# Patient Record
Sex: Female | Born: 1980
Health system: Southern US, Community
[De-identification: ages and names within clinical notes are randomized; demographics above are authoritative.]

## PROBLEM LIST (undated history)

## (undated) DIAGNOSIS — E611 Iron deficiency: Secondary | ICD-10-CM

## (undated) DIAGNOSIS — E785 Hyperlipidemia, unspecified: Secondary | ICD-10-CM

## (undated) DIAGNOSIS — G4730 Sleep apnea, unspecified: Secondary | ICD-10-CM

## (undated) DIAGNOSIS — F419 Anxiety disorder, unspecified: Secondary | ICD-10-CM

## (undated) DIAGNOSIS — K8020 Calculus of gallbladder without cholecystitis without obstruction: Secondary | ICD-10-CM

## (undated) DIAGNOSIS — Z9889 Other specified postprocedural states: Secondary | ICD-10-CM

## (undated) DIAGNOSIS — R112 Nausea with vomiting, unspecified: Secondary | ICD-10-CM

## (undated) DIAGNOSIS — E282 Polycystic ovarian syndrome: Secondary | ICD-10-CM

## (undated) DIAGNOSIS — K219 Gastro-esophageal reflux disease without esophagitis: Secondary | ICD-10-CM

## (undated) DIAGNOSIS — J45909 Unspecified asthma, uncomplicated: Secondary | ICD-10-CM

## (undated) DIAGNOSIS — F909 Attention-deficit hyperactivity disorder, unspecified type: Secondary | ICD-10-CM

## (undated) HISTORY — DX: Polycystic ovarian syndrome: E28.2

## (undated) HISTORY — PX: NASAL FRACTURE SURGERY: SHX718

## (undated) HISTORY — PX: FRACTURE SURGERY: SHX138

## (undated) HISTORY — DX: Attention-deficit hyperactivity disorder, unspecified type: F90.9

---

## 2008-03-01 ENCOUNTER — Emergency Department: Payer: Self-pay | Admitting: Emergency Medicine

## 2008-06-10 ENCOUNTER — Emergency Department: Payer: Self-pay | Admitting: Emergency Medicine

## 2008-07-19 ENCOUNTER — Emergency Department (HOSPITAL_COMMUNITY): Admission: EM | Admit: 2008-07-19 | Discharge: 2008-07-19 | Payer: Self-pay | Admitting: Emergency Medicine

## 2008-07-21 ENCOUNTER — Ambulatory Visit (HOSPITAL_BASED_OUTPATIENT_CLINIC_OR_DEPARTMENT_OTHER): Admission: RE | Admit: 2008-07-21 | Discharge: 2008-07-21 | Payer: Self-pay | Admitting: Otolaryngology
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
## 2009-10-14 ENCOUNTER — Emergency Department: Payer: Self-pay | Admitting: Emergency Medicine

## 2010-07-30 ENCOUNTER — Emergency Department: Payer: Self-pay | Admitting: Emergency Medicine

## 2010-12-08 ENCOUNTER — Ambulatory Visit: Payer: Self-pay | Admitting: Orthopedic Surgery

## 2010-12-08 HISTORY — PX: GANGLION CYST EXCISION: SHX1691

## 2010-12-14 LAB — PATHOLOGY REPORT
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
## 2011-09-15 LAB — POCT HEMOGLOBIN-HEMACUE: Hemoglobin: 13.8

## 2011-11-16 ENCOUNTER — Emergency Department: Payer: Self-pay | Admitting: Emergency Medicine

## 2014-12-04 DIAGNOSIS — R413 Other amnesia: Secondary | ICD-10-CM

## 2014-12-04 DIAGNOSIS — R41840 Attention and concentration deficit: Secondary | ICD-10-CM

## 2015-03-26 ENCOUNTER — Ambulatory Visit
Admit: 2015-03-26 | Disposition: A | Discharge status: Home / Self Care | Payer: Self-pay | Attending: Unknown Physician Specialty | Admitting: Unknown Physician Specialty

## 2015-04-30 ENCOUNTER — Encounter: Payer: Self-pay | Admitting: *Deleted

## 2015-04-30 ENCOUNTER — Ambulatory Visit: Payer: 59 | Admitting: Anesthesiology

## 2015-04-30 ENCOUNTER — Ambulatory Visit
Admission: RE | Admit: 2015-04-30 | Discharge: 2015-04-30 | Disposition: A | Discharge status: Home / Self Care | Payer: 59 | Source: Ambulatory Visit | Attending: Unknown Physician Specialty | Admitting: Unknown Physician Specialty

## 2015-04-30 ENCOUNTER — Encounter
Admission: RE | Disposition: A | Discharge status: Home / Self Care | Payer: Self-pay | Source: Ambulatory Visit | Attending: Unknown Physician Specialty

## 2015-04-30 DIAGNOSIS — K210 Gastro-esophageal reflux disease with esophagitis: Secondary | ICD-10-CM | POA: Diagnosis not present

## 2015-04-30 DIAGNOSIS — K633 Ulcer of intestine: Secondary | ICD-10-CM | POA: Diagnosis not present

## 2015-04-30 DIAGNOSIS — R1012 Left upper quadrant pain: Secondary | ICD-10-CM | POA: Insufficient documentation

## 2015-04-30 DIAGNOSIS — Z6830 Body mass index (BMI) 30.0-30.9, adult: Secondary | ICD-10-CM | POA: Insufficient documentation

## 2015-04-30 DIAGNOSIS — K640 First degree hemorrhoids: Secondary | ICD-10-CM | POA: Insufficient documentation

## 2015-04-30 DIAGNOSIS — J45990 Exercise induced bronchospasm: Secondary | ICD-10-CM | POA: Insufficient documentation

## 2015-04-30 DIAGNOSIS — J342 Deviated nasal septum: Secondary | ICD-10-CM | POA: Diagnosis not present

## 2015-04-30 DIAGNOSIS — K529 Noninfective gastroenteritis and colitis, unspecified: Secondary | ICD-10-CM | POA: Diagnosis not present

## 2015-04-30 DIAGNOSIS — K2950 Unspecified chronic gastritis without bleeding: Secondary | ICD-10-CM | POA: Diagnosis not present

## 2015-04-30 DIAGNOSIS — Z881 Allergy status to other antibiotic agents status: Secondary | ICD-10-CM | POA: Diagnosis not present

## 2015-04-30 DIAGNOSIS — Z79899 Other long term (current) drug therapy: Secondary | ICD-10-CM | POA: Diagnosis not present

## 2015-04-30 DIAGNOSIS — R634 Abnormal weight loss: Secondary | ICD-10-CM | POA: Diagnosis not present

## 2015-04-30 DIAGNOSIS — R1032 Left lower quadrant pain: Secondary | ICD-10-CM | POA: Insufficient documentation

## 2015-04-30 DIAGNOSIS — E669 Obesity, unspecified: Secondary | ICD-10-CM | POA: Diagnosis not present

## 2015-04-30 HISTORY — PX: ESOPHAGOGASTRODUODENOSCOPY: SHX5428

## 2015-04-30 HISTORY — DX: Gastro-esophageal reflux disease without esophagitis: K21.9

## 2015-04-30 HISTORY — DX: Unspecified asthma, uncomplicated: J45.909

## 2015-04-30 HISTORY — DX: Other specified postprocedural states: Z98.890

## 2015-04-30 HISTORY — PX: COLONOSCOPY: SHX5424

## 2015-04-30 HISTORY — DX: Anxiety disorder, unspecified: F41.9

## 2015-04-30 HISTORY — DX: Nausea with vomiting, unspecified: R11.2

## 2015-04-30 LAB — POCT PREGNANCY, URINE: PREG TEST UR: NEGATIVE

## 2015-04-30 SURGERY — EGD (ESOPHAGOGASTRODUODENOSCOPY)
Anesthesia: General

## 2015-04-30 MED ORDER — PROPOFOL 10 MG/ML IV BOLUS
INTRAVENOUS | Status: DC | PRN
  Administered 2015-04-30: 400 mg via INTRAVENOUS
  Administered 2015-04-30: 60 mg via INTRAVENOUS

## 2015-04-30 MED ORDER — PROPOFOL INFUSION 10 MG/ML OPTIME
INTRAVENOUS | Status: DC | PRN
  Administered 2015-04-30: 100 ug/kg/min via INTRAVENOUS

## 2015-04-30 MED ORDER — MIDAZOLAM HCL 2 MG/2ML IJ SOLN
INTRAMUSCULAR | Status: DC | PRN
  Administered 2015-04-30: 2 mg via INTRAVENOUS

## 2015-04-30 MED ORDER — FENTANYL CITRATE (PF) 100 MCG/2ML IJ SOLN: 25.0000 ug | INTRAMUSCULAR | Status: DC | PRN

## 2015-04-30 MED ORDER — FENTANYL CITRATE (PF) 100 MCG/2ML IJ SOLN
INTRAMUSCULAR | Status: DC | PRN
  Administered 2015-04-30: 50 ug via INTRAVENOUS

## 2015-04-30 MED ORDER — SODIUM CHLORIDE 0.9 % IV SOLN
INTRAVENOUS | Status: DC
  Administered 2015-04-30: 09:00:00 via INTRAVENOUS

## 2015-04-30 MED ORDER — SODIUM CHLORIDE 0.9 % IV SOLN: INTRAVENOUS | Status: DC

## 2015-04-30 MED ORDER — ONDANSETRON HCL 4 MG/2ML IJ SOLN: 4.0000 mg | Freq: Once | INTRAMUSCULAR | Status: DC | PRN

## 2015-04-30 MED ORDER — LIDOCAINE HCL (CARDIAC) 10 MG/ML IV SOLN
INTRAVENOUS | Status: DC | PRN
  Administered 2015-04-30: 100 mg via INTRAVENOUS

## 2015-04-30 NOTE — Anesthesia Preprocedure Evaluation (Addendum)
Anesthesia Evaluation  Patient identified by MRN, date of birth, ID band Patient awake    Reviewed: Allergy & Precautions, NPO status , Patient's Chart, lab work & pertinent test results  History of Anesthesia Complications (+) PONV  Airway Mallampati: II       Dental no notable dental hx.    Pulmonary asthma ,  breath sounds clear to auscultation  Pulmonary exam normal       Cardiovascular negative cardio ROS  Rhythm:Regular Rate:Normal     Neuro/Psych Anxiety negative neurological ROS     GI/Hepatic Neg liver ROS, GERD-  Controlled,  Endo/Other  negative endocrine ROS  Renal/GU negative Renal ROS  negative genitourinary   Musculoskeletal negative musculoskeletal ROS (+)   Abdominal   Peds negative pediatric ROS (+)  Hematology negative hematology ROS (+)   Anesthesia Other Findings   Reproductive/Obstetrics negative OB ROS                             Anesthesia Physical Anesthesia Plan  ASA: II  Anesthesia Plan: General   Post-op Pain Management:    Induction: Intravenous  Airway Management Planned: Nasal Cannula  Additional Equipment:   Intra-op Plan:   Post-operative Plan:   Informed Consent: I have reviewed the patients History and Physical, chart, labs and discussed the procedure including the risks, benefits and alternatives for the proposed anesthesia with the patient or authorized representative who has indicated his/her understanding and acceptance.   Dental advisory given  Plan Discussed with: CRNA and Surgeon  Anesthesia Plan Comments:         Anesthesia Quick Evaluation

## 2015-04-30 NOTE — Anesthesia Postprocedure Evaluation (Signed)
  Anesthesia Post-op Note  Patient: Heather Ferguson  Procedure(s) Performed: Procedure(s): ESOPHAGOGASTRODUODENOSCOPY (EGD) (N/A) COLONOSCOPY (N/A)  Anesthesia type:General  Patient location: PACU  Post pain: Pain level controlled  Post assessment: Post-op Vital signs reviewed, Patient's Cardiovascular Status Stable, Respiratory Function Stable, Patent Airway and No signs of Nausea or vomiting  Post vital signs: Reviewed and stable  Last Vitals:  Filed Vitals:   04/30/15 1000  BP: 109/72  Pulse: 67  Temp: 36 C  Resp: 14    Level of consciousness: awake, alert  and patient cooperative  Complications: No apparent anesthesia complications

## 2015-04-30 NOTE — Anesthesia Procedure Notes (Signed)
Performed by: Henrietta HooverPOPE, Laquisha Northcraft Pre-anesthesia Checklist: Patient identified, Emergency Drugs available, Suction available and Patient being monitored Oxygen Delivery Method: Nasal cannula Intubation Type: IV induction

## 2015-04-30 NOTE — Op Note (Signed)
[facility] Gastroenterology Patient Name: Heather OlszewskiLindsey Terrero Procedure Date: 04/30/2015 9:03 AM MRN: [id number] Account #: [id number] Date of Birth: [birth year] Admit Type: Outpatient Age: [id number] [facility] Gender: Female Note Status: Finalized Procedure:         Upper GI endoscopy Indications:       Abdominal pain in the left upper quadrant Providers:         Scot Junobert T. Elliott, MD Medicines:         Propofol per Anesthesia Complications:     No immediate complications. Procedure:         Pre-Anesthesia Assessment:                    - After reviewing the risks and benefits, the patient was                     deemed in satisfactory condition to undergo the procedure.                    - After reviewing the risks and benefits, the patient was                     deemed in satisfactory condition to undergo the procedure.                    After obtaining informed consent, the endoscope was passed                     under direct vision. Throughout the procedure, the                     patient's blood pressure, pulse, and oxygen saturations                     were monitored continuously. The Endoscope was introduced                     through the mouth, and advanced to the second part of                     duodenum. The upper GI endoscopy was accomplished without                     difficulty. The patient tolerated the procedure well. Findings:      The Z-line was variable and was found 38 cm from the incisors. To check       for Barretts esophagus biopsies were taken with a cold forceps for       histology.      Localized slightly granular mucosa was found focally in the gastric body       and in the gastric antrum. Biopsies were taken with a cold forceps for       Helicobacter pylori testing. Biopsies were taken with a cold forceps for       histology.      Localized mild inflammation characterized by erythema and granularity       was found in the  duodenal bulb. Impression:        - Z-line variable, 38 cm from the incisors. Biopsied.                    - Granular gastric mucosa. Biopsied.                    -  Normal examined duodenum. Recommendation:    - Await pathology results. Do colonoscopy. Scot Junobert T Elliott, MD 04/30/2015 9:23:14 AM This report has been signed electronically. Number of Addenda: 0 Note Initiated On: 04/30/2015 9:03 AM      [facility]

## 2015-04-30 NOTE — Op Note (Signed)
[facility] Gastroenterology Patient Name: Heather OlszewskiLindsey Heathman Procedure Date: 04/30/2015 9:02 AM MRN: [id number] Account #: [id number] Date of Birth: 03/22/1981 Admit Type: Outpatient Age: [age] [facility] Gender: Female Note Status: Finalized Procedure:         Colonoscopy Indications:       Abdominal pain in the left lower quadrant, Abdominal pain                     in the left upper quadrant Providers:         Scot Junobert T. Leonides Minder, MD Medicines:         Propofol per Anesthesia Complications:     No immediate complications. Procedure:         Pre-Anesthesia Assessment:                    - After reviewing the risks and benefits, the patient was                     deemed in satisfactory condition to undergo the procedure.                    After obtaining informed consent, the colonoscope was                     passed under direct vision. Throughout the procedure, the                     patient's blood pressure, pulse, and oxygen saturations                     were monitored continuously. The Olympus PCF-160AL                     Colonoscope (S# [id number]) was introduced through the anus                     and advanced to the the cecum, identified by appendiceal                     orifice and ileocecal valve. The colonoscopy was performed                     without difficulty. The patient tolerated the procedure                     well. The quality of the bowel preparation was excellent. Findings:      Internal hemorrhoids were found during endoscopy. The hemorrhoids were       medium-sized and Grade I (internal hemorrhoids that do not prolapse).      One Nonbleeding superficial spot of ulcerated mucosa withsurrounding       erythema and no stigmata of recent bleeding was present in the       descending colon. Biopsy was taken with a cold forceps for histology.      The exam was otherwise without abnormality. Impression:        - Internal hemorrhoids.                    - Mucosal ulceration. Biopsied. Recommendation:    - Await pathology results. Scot Junobert T [name], MD 04/30/2015 9:49:06 AM This report has been signed electronically. Number of Addenda: 0 Note Initiated On: 04/30/2015 9:02 AM Scope Withdrawal Time: 0 hours 11 minutes 39  seconds  Total Procedure Duration: 0 hours 17 minutes 40 seconds       [facility]

## 2015-04-30 NOTE — Transfer of Care (Signed)
Immediate Anesthesia Transfer of Care Note  Patient: Heather Ferguson  Procedure(s) Performed: Procedure(s): ESOPHAGOGASTRODUODENOSCOPY (EGD) (N/A) COLONOSCOPY (N/A)  Patient Location: PACU  Anesthesia Type:General  Level of Consciousness: sedated  Airway & Oxygen Therapy: Patient Spontanous Breathing and Patient connected to nasal cannula oxygen  Post-op Assessment: Report given to RN  Post vital signs: Reviewed and stable  Last Vitals:  Filed Vitals:   04/30/15 0824  BP: 97/54  Pulse: 77  Temp: 36.8 C  Resp: 17    Complications: No apparent anesthesia complications

## 2015-04-30 NOTE — H&P (Signed)
The recent H&P (dated 04/06/2015  was reviewed, the patient was examined and there is no change in the patients condition since that H&P was completed.   Lynnae PrudeLLIOTT, Abeer Iversen  04/30/2015, 8:58 AM

## 2015-05-03 ENCOUNTER — Encounter: Payer: Self-pay | Admitting: Unknown Physician Specialty

## 2015-05-03 LAB — SURGICAL PATHOLOGY

## 2015-05-12 NOTE — Addendum Note (Signed)
Addendum  created 05/12/15 1432 by Henrietta HooverKimberly Abria Vannostrand, CRNA   Modules edited: Anesthesia Responsible Staff

## 2016-11-30 ENCOUNTER — Ambulatory Visit (INDEPENDENT_AMBULATORY_CARE_PROVIDER_SITE_OTHER): Payer: 59 | Admitting: Obstetrics and Gynecology

## 2016-11-30 ENCOUNTER — Encounter: Payer: Self-pay | Admitting: Obstetrics and Gynecology

## 2016-11-30 ENCOUNTER — Other Ambulatory Visit: Payer: Self-pay | Admitting: Obstetrics and Gynecology

## 2016-11-30 VITALS — BP 140/82 | HR 100 | Ht 66.0 in | Wt 213.0 lb

## 2016-11-30 DIAGNOSIS — Z8742 Personal history of other diseases of the female genital tract: Secondary | ICD-10-CM

## 2016-11-30 DIAGNOSIS — Z23 Encounter for immunization: Secondary | ICD-10-CM

## 2016-11-30 DIAGNOSIS — Z01419 Encounter for gynecological examination (general) (routine) without abnormal findings: Secondary | ICD-10-CM

## 2016-11-30 NOTE — Addendum Note (Signed)
Addended by: Rosine BeatLONTZ, Tayley Mudrick L on: 11/30/2016 05:04 PM   Modules accepted: Orders

## 2016-11-30 NOTE — Progress Notes (Signed)
Subjective:   Heather Ferguson is a 35 y.o. G0P0000 Caucasian female here for a routine well-woman exam.  Patient's last menstrual period was 11/21/2016.    Current complaints: BLOATING BEFORE MENSES, used BC in past, this current menses was sorted and lighter, but bloating was worse. Has not been able to lose any weight. 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  Past Medical History Past Medical History:  Diagnosis Date  . ADHD   . Anxiety   . Asthma   . GERD (gastroesophageal reflux disease)   . PCOS (polycystic ovarian syndrome)   . PONV (postoperative nausea and vomiting)     Past Surgical History Past Surgical History:  Procedure Laterality Date  . COLONOSCOPY N/A 04/30/2015   Procedure: COLONOSCOPY;  Surgeon: Scot Junobert T Elliott, MD;  [facility];  Service: Endoscopy;  Laterality: N/A;  . ESOPHAGOGASTRODUODENOSCOPY N/A 04/30/2015   Procedure: ESOPHAGOGASTRODUODENOSCOPY (EGD);  Surgeon: Scot Junobert T Elliott, MD;  [facility];  Service: Endoscopy;  Laterality: N/A;  . FRACTURE SURGERY      Gynecologic History G0P0000  Patient's last menstrual period was 11/21/2016. Contraception: none Last Pap:2015. Results were: normal   Obstetric History OB History  Gravida Para Term Preterm AB Living  0 0 0 0 0 0  SAB TAB Ectopic Multiple Live Births  0 0 0 0 0        Current Medications Current Outpatient Prescriptions on File Prior to Visit  Medication Sig Dispense Refill  . esomeprazole (NEXIUM) 20 MG capsule  Take 20 mg by mouth daily at 12 noon.     No current facility-administered medications on file prior to visit.     Review of Systems Patient denies any headaches, blurred vision, shortness of breath, chest pain, abdominal pain, problems with bowel movements, urination, or intercourse.  Objective:  BP 140/82   Pulse 100   Ht 5\' 6"  (1.676 m)   Wt 213 lb (96.6 kg)   LMP 11/21/2016   BMI 34.38 kg/m  Physical Exam  General:  Well developed, well nourished, no acute distress. She is alert and oriented x3. Skin:  Warm and dry Neck:  Midline trachea, no thyromegaly or nodules Cardiovascular: Regular rate and rhythm, no murmur heard Lungs:  Effort normal, all lung fields clear to auscultation bilaterally Breasts:  No dominant palpable mass, retraction, or nipple discharge Abdomen:  Soft, non tender, no hepatosplenomegaly or masses Pelvic:  External genitalia is normal in appearance.  The vagina is normal in appearance. The cervix is bulbous, no CMT.  Thin prep pap is done with HR HPV cotesting. Uterus is felt to be normal size, shape, and contour.  No adnexal masses or tenderness note Extremities:  No swelling or varicosities noted Psych:  She has a normal mood and affect  Assessment:   Healthy well-woman exam H/o PCOS Obesity PMS Needs flu vaccine   Plan:  Will do labs and u/s for PCOS workup and follow up accordingly Flu vaccine given today  F/U 1 year for AE, or sooner if needed   Melody Suzan NailerN Shambley, CNM

## 2016-12-04 LAB — CYTOLOGY - PAP

## 2016-12-05 ENCOUNTER — Other Ambulatory Visit: Payer: 59

## 2016-12-05 ENCOUNTER — Ambulatory Visit (INDEPENDENT_AMBULATORY_CARE_PROVIDER_SITE_OTHER): Payer: 59

## 2016-12-05 DIAGNOSIS — Z8742 Personal history of other diseases of the female genital tract: Secondary | ICD-10-CM | POA: Diagnosis not present

## 2016-12-07 LAB — THYROID PANEL WITH TSH
FREE THYROXINE INDEX: 1.7 (ref 1.2–4.9)
T3 UPTAKE RATIO: 25 % (ref 24–39)
T4 TOTAL: 6.8 ug/dL (ref 4.5–12.0)
TSH: 1.21 u[IU]/mL (ref 0.450–4.500)

## 2016-12-07 LAB — FSH/LH
FSH: 4.2 m[IU]/mL
LH: 10.8 m[IU]/mL

## 2016-12-07 LAB — COMPREHENSIVE METABOLIC PANEL
A/G RATIO: 1.9 (ref 1.2–2.2)
ALBUMIN: 4.4 g/dL (ref 3.5–5.5)
ALT: 18 IU/L (ref 0–32)
AST: 14 IU/L (ref 0–40)
Alkaline Phosphatase: 53 IU/L (ref 39–117)
BILIRUBIN TOTAL: 0.2 mg/dL (ref 0.0–1.2)
BUN / CREAT RATIO: 10 (ref 9–23)
BUN: 9 mg/dL (ref 6–20)
CALCIUM: 9 mg/dL (ref 8.7–10.2)
CHLORIDE: 104 mmol/L (ref 96–106)
CO2: 21 mmol/L (ref 18–29)
Creatinine, Ser: 0.89 mg/dL (ref 0.57–1.00)
GFR calc non Af Amer: 85 mL/min/{1.73_m2} (ref >59)
GFR, EST AFRICAN AMERICAN: 98 mL/min/{1.73_m2} (ref >59)
Globulin, Total: 2.3 g/dL (ref 1.5–4.5)
Glucose: 96 mg/dL (ref 65–99)
POTASSIUM: 4.2 mmol/L (ref 3.5–5.2)
Sodium: 140 mmol/L (ref 134–144)
Total Protein: 6.7 g/dL (ref 6.0–8.5)

## 2016-12-07 LAB — TESTOSTERONE, FREE, TOTAL, SHBG
SEX HORMONE BINDING: 25.4 nmol/L (ref 24.6–122.0)
TESTOSTERONE: 38 ng/dL (ref 8–48)
Testosterone, Free: 3.6 pg/mL (ref 0.0–4.2)

## 2016-12-07 LAB — CBC
Hematocrit: 40.5 % (ref 34.0–46.6)
Hemoglobin: 13.9 g/dL (ref 11.1–15.9)
MCH: 29.2 pg (ref 26.6–33.0)
MCHC: 34.3 g/dL (ref 31.5–35.7)
MCV: 85 fL (ref 79–97)
Platelets: 255 10*3/uL (ref 150–379)
RBC: 4.76 x10E6/uL (ref 3.77–5.28)
RDW: 12.9 % (ref 12.3–15.4)
WBC: 8.8 10*3/uL (ref 3.4–10.8)

## 2016-12-07 LAB — LIPID PANEL
CHOLESTEROL TOTAL: 205 mg/dL — AB (ref 100–199)
Chol/HDL Ratio: 5.3 ratio units — ABNORMAL HIGH (ref 0.0–4.4)
HDL: 39 mg/dL — AB (ref >39)
LDL Calculated: 147 mg/dL — ABNORMAL HIGH (ref 0–99)
Triglycerides: 95 mg/dL (ref 0–149)
VLDL Cholesterol Cal: 19 mg/dL (ref 5–40)

## 2016-12-07 LAB — DHEA-SULFATE: DHEA-SO4: 169.2 ug/dL (ref 84.8–378.0)

## 2016-12-07 LAB — HEMOGLOBIN A1C
Est. average glucose Bld gHb Est-mCnc: 103 mg/dL
Hgb A1c MFr Bld: 5.2 % (ref 4.8–5.6)

## 2016-12-07 LAB — INSULIN, RANDOM: INSULIN: 22.2 u[IU]/mL (ref 2.6–24.9)

## 2016-12-07 LAB — ESTRADIOL: Estradiol: 49.3 pg/mL

## 2016-12-07 LAB — PROGESTERONE: Progesterone: <0.1 ng/mL

## 2016-12-07 LAB — VITAMIN D 25 HYDROXY (VIT D DEFICIENCY, FRACTURES): Vit D, 25-Hydroxy: 27.1 ng/mL — ABNORMAL LOW (ref 30.0–100.0)

## 2016-12-12 ENCOUNTER — Other Ambulatory Visit: Payer: Self-pay | Admitting: Obstetrics and Gynecology

## 2016-12-12 DIAGNOSIS — E785 Hyperlipidemia, unspecified: Secondary | ICD-10-CM

## 2016-12-12 DIAGNOSIS — E559 Vitamin D deficiency, unspecified: Secondary | ICD-10-CM | POA: Insufficient documentation

## 2016-12-12 MED ORDER — VITAMIN D (ERGOCALCIFEROL) 1.25 MG (50000 UNIT) PO CAPS: 50000.0000 [IU] | ORAL_CAPSULE | ORAL | 1 refills | Status: DC

## 2016-12-13 ENCOUNTER — Encounter: Payer: Self-pay | Admitting: Obstetrics and Gynecology

## 2016-12-15 ENCOUNTER — Encounter: Payer: Self-pay | Admitting: Obstetrics and Gynecology

## 2016-12-15 ENCOUNTER — Ambulatory Visit (INDEPENDENT_AMBULATORY_CARE_PROVIDER_SITE_OTHER): Payer: 59 | Admitting: Obstetrics and Gynecology

## 2016-12-15 VITALS — BP 133/81 | HR 99 | Ht 66.0 in | Wt 215.7 lb

## 2016-12-15 DIAGNOSIS — E282 Polycystic ovarian syndrome: Secondary | ICD-10-CM

## 2016-12-15 DIAGNOSIS — Z79899 Other long term (current) drug therapy: Secondary | ICD-10-CM

## 2016-12-15 MED ORDER — METFORMIN HCL 500 MG PO TABS: 500.0000 mg | ORAL_TABLET | Freq: Two times a day (BID) | ORAL | 6 refills | Status: DC

## 2016-12-15 MED ORDER — ESOMEPRAZOLE MAGNESIUM 20 MG PO CPDR: 20.0000 mg | DELAYED_RELEASE_CAPSULE | Freq: Every day | ORAL | 6 refills | Status: DC

## 2016-12-15 MED ORDER — FLUOXETINE HCL 10 MG PO CAPS: 10.0000 mg | ORAL_CAPSULE | Freq: Every day | ORAL | 6 refills | Status: DC

## 2016-12-15 MED ORDER — DROSPIREN-ETH ESTRAD-LEVOMEFOL 3-0.02-0.451 MG PO TABS: 1.0000 | ORAL_TABLET | Freq: Every day | ORAL | 11 refills | Status: DC

## 2016-12-15 MED ORDER — ALPRAZOLAM 0.5 MG PO TABS: 0.5000 mg | ORAL_TABLET | Freq: Three times a day (TID) | ORAL | 2 refills | Status: DC | PRN

## 2016-12-15 NOTE — Progress Notes (Signed)
Subjective:     Patient ID: Heather Ferguson, female   DOB: [birth year], 35 y.o.   MRN: [id number]  HPI Here to review labs and ultrasound below:  Indications:Possible PCOS Findings:  The uterus measures 8.5 x 4.2 x 4.8 cm. Echo texture is homogenous without evidence of focal masses.  The Endometrium measures 7.4 mm.  Right Ovary measures 3.8 x 2.3 x 2.5 cm.  Left Ovary measures 4.7 x 2.6 x 3.7 cm. Both ovaries are polycystic in appearance. Survey of the adnexa demonstrates no adnexal masses. There is no free fluid in the cul de sac.  Review of Systems Negative to date    Objective:   Physical Exam A&O x4 Well groomed female in no distress Blood pressure 133/81, pulse 99, height 5\' 6"  (1.676 m), weight 215 lb 11.2 oz (97.8 kg), last menstrual period 11/21/2016. PE not indicated    Assessment:     PCOS Vitamin d deficiency Depression with anxiety ADD Obesity     Plan:     Will start metformin 500mg  bid and work toward increasing to 850mg  bid as tolerated Switch from lexapro to prozac and add xanax for prn use. Start [place]Yaz  RTC 6 weeks  Jung Ingerson [place] Lac[place], [place]CNM

## 2016-12-20 ENCOUNTER — Encounter: Payer: Self-pay | Admitting: Certified Nurse Midwife

## 2016-12-20 ENCOUNTER — Encounter: Payer: Self-pay | Admitting: Obstetrics and Gynecology

## 2016-12-24 ENCOUNTER — Encounter: Payer: Self-pay | Admitting: Obstetrics and Gynecology

## 2016-12-27 ENCOUNTER — Encounter: Payer: Self-pay | Admitting: Obstetrics and Gynecology

## 2016-12-27 ENCOUNTER — Other Ambulatory Visit: Payer: Self-pay | Admitting: *Deleted

## 2016-12-27 MED ORDER — AZITHROMYCIN 250 MG PO TABS: ORAL_TABLET | ORAL | 0 refills | Status: DC

## 2017-01-05 DIAGNOSIS — J069 Acute upper respiratory infection, unspecified: Secondary | ICD-10-CM | POA: Diagnosis not present

## 2017-01-05 DIAGNOSIS — R05 Cough: Secondary | ICD-10-CM | POA: Diagnosis not present

## 2017-01-26 ENCOUNTER — Encounter: Payer: Self-pay | Admitting: Obstetrics and Gynecology

## 2017-01-26 ENCOUNTER — Ambulatory Visit (INDEPENDENT_AMBULATORY_CARE_PROVIDER_SITE_OTHER): Payer: 59 | Admitting: Obstetrics and Gynecology

## 2017-01-26 VITALS — BP 112/73 | HR 61 | Wt 208.5 lb

## 2017-01-26 DIAGNOSIS — Z79899 Other long term (current) drug therapy: Secondary | ICD-10-CM | POA: Diagnosis not present

## 2017-01-26 MED ORDER — METFORMIN HCL 850 MG PO TABS: 850.0000 mg | ORAL_TABLET | Freq: Two times a day (BID) | ORAL | 2 refills | Status: DC

## 2017-01-26 NOTE — Progress Notes (Signed)
Pt is here for a followup after starting new meds.

## 2017-01-26 NOTE — Progress Notes (Signed)
Subjective:     Patient ID: Heather Ferguson, female   DOB: 02-Aug-1981, 36 y.o.   MRN: [id number]  HPI Started on Beyaz and Metformin 6 weeks ago for PCOS, here to review medications and see how things are going. States a little nausea at first, but figured out she needed to eat with morning dose and nausea subsided. Did have irregular spotting in 3rd week of first pack.none since. Has lost 5 #, hasn't increased exercise yet, as she just got over bronchitis.  Review of Systems Negative except stated above in HPI    Objective:   Physical Exam A&O x4 well groomed female in no distress Blood pressure 112/73, pulse 61, weight 208 lb 8 oz (94.6 kg), last menstrual period 01/05/2017. PE not indicated    Assessment:     Medication management for PCOS    Plan:     Will increase metformin to 850mg  bid for 2 months, and continue BeYaz. Encouraged increase in exercise.  RTC in 8 weeks for repeat labs and recheck.  Neeley Sedivy [place]iff[place]y, CNM

## 2017-02-05 ENCOUNTER — Encounter: Payer: Self-pay | Admitting: Obstetrics and Gynecology

## 2017-02-06 ENCOUNTER — Other Ambulatory Visit: Payer: Self-pay | Admitting: *Deleted

## 2017-02-06 MED ORDER — ESOMEPRAZOLE MAGNESIUM 20 MG PO CPDR: 40.0000 mg | DELAYED_RELEASE_CAPSULE | Freq: Every day | ORAL | 6 refills | Status: DC

## 2017-02-12 ENCOUNTER — Other Ambulatory Visit: Payer: Self-pay | Admitting: Obstetrics and Gynecology

## 2017-02-13 ENCOUNTER — Other Ambulatory Visit: Payer: Self-pay | Admitting: *Deleted

## 2017-02-13 ENCOUNTER — Encounter: Payer: Self-pay | Admitting: Obstetrics and Gynecology

## 2017-02-13 MED ORDER — ESOMEPRAZOLE MAGNESIUM 20 MG PO CPDR: 40.0000 mg | DELAYED_RELEASE_CAPSULE | Freq: Every day | ORAL | 6 refills | Status: DC

## 2017-02-16 ENCOUNTER — Other Ambulatory Visit: Payer: Self-pay | Admitting: *Deleted

## 2017-02-16 MED ORDER — ESOMEPRAZOLE MAGNESIUM 40 MG PO CPDR: 40.0000 mg | DELAYED_RELEASE_CAPSULE | Freq: Every day | ORAL | 4 refills | Status: DC

## 2017-02-27 DIAGNOSIS — J029 Acute pharyngitis, unspecified: Secondary | ICD-10-CM | POA: Diagnosis not present

## 2017-02-27 DIAGNOSIS — H6692 Otitis media, unspecified, left ear: Secondary | ICD-10-CM | POA: Diagnosis not present

## 2017-03-23 ENCOUNTER — Encounter: Payer: 59 | Admitting: Obstetrics and Gynecology

## 2017-04-05 ENCOUNTER — Encounter: Payer: 59 | Admitting: Obstetrics and Gynecology

## 2017-04-09 ENCOUNTER — Encounter: Payer: 59 | Admitting: Obstetrics and Gynecology

## 2017-04-25 ENCOUNTER — Ambulatory Visit (INDEPENDENT_AMBULATORY_CARE_PROVIDER_SITE_OTHER): Payer: 59 | Admitting: Obstetrics and Gynecology

## 2017-04-25 ENCOUNTER — Encounter: Payer: Self-pay | Admitting: Obstetrics and Gynecology

## 2017-04-25 VITALS — BP 108/76 | HR 74 | Ht 66.0 in | Wt 198.0 lb

## 2017-04-25 DIAGNOSIS — Z79899 Other long term (current) drug therapy: Secondary | ICD-10-CM | POA: Diagnosis not present

## 2017-04-25 DIAGNOSIS — E559 Vitamin D deficiency, unspecified: Secondary | ICD-10-CM | POA: Diagnosis not present

## 2017-04-25 DIAGNOSIS — E282 Polycystic ovarian syndrome: Secondary | ICD-10-CM

## 2017-04-25 DIAGNOSIS — E669 Obesity, unspecified: Secondary | ICD-10-CM

## 2017-04-25 MED ORDER — METFORMIN HCL 850 MG PO TABS: 850.0000 mg | ORAL_TABLET | Freq: Every day | ORAL | 2 refills | Status: DC

## 2017-04-25 MED ORDER — METFORMIN HCL 500 MG PO TABS: 500.0000 mg | ORAL_TABLET | Freq: Every day | ORAL | 2 refills | Status: DC

## 2017-04-25 NOTE — Progress Notes (Signed)
Subjective:     Patient ID: Heather Ferguson, female   DOB: Mar 13, 1981, 36 y.o.   MRN: [id number]  HPI On metformin and beyaz for PCOS. Stopped metformin 3 days ago due to diarrhea. Diarrhea worse in daytime.and after salads.  Denies spotting on Beyaz.  Does report a lot of stress over the last few weeks, with buying a beach house, extra work, Catering manageretc. Lastly states she feels really tired.  Review of Systems   Negative except started in HPI    Objective:   Physical Exam A&O x4 Well groomed female in no distress Blood pressure 108/76, pulse 74, height 5\' 6"  (1.676 m), weight 198 lb (89.8 kg), last menstrual period 04/07/2017. Labs obtained awaiting results.  PE not indicated     Assessment:     PCOS Obesity Fatigue Vitamin d deficiency    Plan:     To continue Beyaz. Switch metformin to 500mg  in am and 850 with dinner. Continue vit D weekly. RTC in 6 months.  Sheenah Dimitroff Hills[place], CNM

## 2017-04-26 LAB — LIPID PANEL
CHOL/HDL RATIO: 4.1 ratio (ref 0.0–4.4)
Cholesterol, Total: 231 mg/dL — ABNORMAL HIGH (ref 100–199)
HDL: 57 mg/dL (ref >39)
LDL Calculated: 146 mg/dL — ABNORMAL HIGH (ref 0–99)
Triglycerides: 142 mg/dL (ref 0–149)
VLDL Cholesterol Cal: 28 mg/dL (ref 5–40)

## 2017-04-26 LAB — COMPREHENSIVE METABOLIC PANEL
ALT: 15 IU/L (ref 0–32)
AST: 14 IU/L (ref 0–40)
Albumin/Globulin Ratio: 1.6 (ref 1.2–2.2)
Albumin: 4.4 g/dL (ref 3.5–5.5)
Alkaline Phosphatase: 53 IU/L (ref 39–117)
BILIRUBIN TOTAL: 0.3 mg/dL (ref 0.0–1.2)
BUN/Creatinine Ratio: 9 (ref 9–23)
BUN: 8 mg/dL (ref 6–20)
CALCIUM: 9.4 mg/dL (ref 8.7–10.2)
CHLORIDE: 101 mmol/L (ref 96–106)
CO2: 22 mmol/L (ref 18–29)
Creatinine, Ser: 0.9 mg/dL (ref 0.57–1.00)
GFR calc Af Amer: 96 mL/min/{1.73_m2} (ref >59)
GFR calc non Af Amer: 84 mL/min/{1.73_m2} (ref >59)
GLUCOSE: 90 mg/dL (ref 65–99)
Globulin, Total: 2.7 g/dL (ref 1.5–4.5)
Potassium: 4.6 mmol/L (ref 3.5–5.2)
Sodium: 139 mmol/L (ref 134–144)
TOTAL PROTEIN: 7.1 g/dL (ref 6.0–8.5)

## 2017-04-26 LAB — HEMOGLOBIN A1C
ESTIMATED AVERAGE GLUCOSE: 105 mg/dL
HEMOGLOBIN A1C: 5.3 % (ref 4.8–5.6)

## 2017-04-26 LAB — INSULIN, RANDOM: INSULIN: 15.4 u[IU]/mL (ref 2.6–24.9)

## 2017-04-26 LAB — VITAMIN D 25 HYDROXY (VIT D DEFICIENCY, FRACTURES): Vit D, 25-Hydroxy: 52.7 ng/mL (ref 30.0–100.0)

## 2017-05-21 ENCOUNTER — Encounter: Payer: Self-pay | Admitting: Obstetrics and Gynecology

## 2017-05-29 ENCOUNTER — Other Ambulatory Visit: Payer: Self-pay | Admitting: Obstetrics and Gynecology

## 2017-05-29 MED ORDER — LEVONORGESTREL-ETHINYL ESTRAD 0.15-30 MG-MCG PO TABS: 1.0000 | ORAL_TABLET | Freq: Every day | ORAL | 6 refills | Status: DC

## 2017-06-25 DIAGNOSIS — S60221A Contusion of right hand, initial encounter: Secondary | ICD-10-CM | POA: Diagnosis not present

## 2017-06-25 DIAGNOSIS — S5001XA Contusion of right elbow, initial encounter: Secondary | ICD-10-CM | POA: Diagnosis not present

## 2017-06-26 DIAGNOSIS — M545 Low back pain: Secondary | ICD-10-CM | POA: Diagnosis not present

## 2017-06-26 DIAGNOSIS — R61 Generalized hyperhidrosis: Secondary | ICD-10-CM | POA: Diagnosis not present

## 2017-09-24 DIAGNOSIS — J069 Acute upper respiratory infection, unspecified: Secondary | ICD-10-CM | POA: Diagnosis not present

## 2017-09-24 DIAGNOSIS — R05 Cough: Secondary | ICD-10-CM | POA: Diagnosis not present

## 2017-10-09 ENCOUNTER — Encounter: Payer: Self-pay | Admitting: Obstetrics and Gynecology

## 2017-11-15 ENCOUNTER — Other Ambulatory Visit: Payer: Self-pay | Admitting: *Deleted

## 2017-11-15 MED ORDER — ALPRAZOLAM 0.5 MG PO TABS: 0.5000 mg | ORAL_TABLET | Freq: Three times a day (TID) | ORAL | 2 refills | Status: DC | PRN

## 2017-12-06 ENCOUNTER — Encounter: Payer: 59 | Admitting: Obstetrics and Gynecology

## 2017-12-12 ENCOUNTER — Other Ambulatory Visit: Payer: Self-pay | Admitting: Obstetrics and Gynecology

## 2017-12-12 ENCOUNTER — Encounter: Payer: Self-pay | Admitting: Obstetrics and Gynecology

## 2017-12-13 ENCOUNTER — Other Ambulatory Visit: Payer: Self-pay | Admitting: *Deleted

## 2017-12-13 MED ORDER — ESOMEPRAZOLE MAGNESIUM 40 MG PO CPDR: 40.0000 mg | DELAYED_RELEASE_CAPSULE | Freq: Every day | ORAL | 4 refills | Status: DC

## 2017-12-20 ENCOUNTER — Encounter: Payer: Self-pay | Admitting: Obstetrics and Gynecology

## 2017-12-21 ENCOUNTER — Other Ambulatory Visit: Payer: Self-pay | Admitting: Obstetrics and Gynecology

## 2017-12-21 MED ORDER — PANTOPRAZOLE SODIUM 40 MG PO TBEC: 40.0000 mg | DELAYED_RELEASE_TABLET | Freq: Every day | ORAL | 6 refills | Status: DC

## 2017-12-22 DIAGNOSIS — R05 Cough: Secondary | ICD-10-CM | POA: Diagnosis not present

## 2017-12-22 DIAGNOSIS — J019 Acute sinusitis, unspecified: Secondary | ICD-10-CM | POA: Diagnosis not present

## 2018-01-13 ENCOUNTER — Other Ambulatory Visit: Payer: Self-pay | Admitting: Obstetrics and Gynecology

## 2018-01-15 DIAGNOSIS — J019 Acute sinusitis, unspecified: Secondary | ICD-10-CM | POA: Diagnosis not present

## 2018-02-05 DIAGNOSIS — J0101 Acute recurrent maxillary sinusitis: Secondary | ICD-10-CM | POA: Diagnosis not present

## 2018-02-05 DIAGNOSIS — J45901 Unspecified asthma with (acute) exacerbation: Secondary | ICD-10-CM | POA: Diagnosis not present

## 2018-02-14 DIAGNOSIS — Z23 Encounter for immunization: Secondary | ICD-10-CM | POA: Diagnosis not present

## 2018-02-22 ENCOUNTER — Encounter: Payer: 59 | Admitting: Obstetrics and Gynecology

## 2018-02-26 DIAGNOSIS — J342 Deviated nasal septum: Secondary | ICD-10-CM | POA: Diagnosis not present

## 2018-02-26 DIAGNOSIS — J343 Hypertrophy of nasal turbinates: Secondary | ICD-10-CM | POA: Diagnosis not present

## 2018-02-26 DIAGNOSIS — J320 Chronic maxillary sinusitis: Secondary | ICD-10-CM | POA: Diagnosis not present

## 2018-03-12 ENCOUNTER — Encounter: Payer: Self-pay | Admitting: *Deleted

## 2018-03-12 ENCOUNTER — Other Ambulatory Visit: Payer: Self-pay

## 2018-03-18 NOTE — Discharge Instructions (Signed)
[facility] °[facility] °ENDOSCOPIC SINUS SURGERY °[facility] ° °What is Functional Endoscopic Sinus Surgery? ° The Surgery involves making the natural openings of the sinuses larger by removing the bony partitions that separate the sinuses from the nasal cavity.  The natural sinus lining is preserved as much as possible to allow the sinuses to resume normal function after the surgery.  In some patients nasal polyps (excessively swollen lining of the sinuses) may be removed to relieve obstruction of the sinus openings.  The surgery is performed through the nose using lighted scopes, which eliminates the need for incisions on the face.  A septoplasty is a different procedure which is sometimes performed with sinus surgery.  It involves straightening the boy partition that separates the two sides of your nose.  A crooked or deviated septum may need repair if is obstructing the sinuses or nasal airflow.  Turbinate reduction is also often performed during sinus surgery.  The turbinates are bony proturberances from the side walls of the nose which swell and can obstruct the nose in patients with sinus and allergy problems.  Their size can be surgically reduced to help relieve nasal obstruction. ° °What Can Sinus Surgery Do For Me? ° Sinus surgery can reduce the frequency of sinus infections requiring antibiotic treatment.  This can provide improvement in nasal congestion, post-nasal drainage, facial pressure and nasal obstruction.  Surgery will NOT prevent you from ever having an infection again, so it usually only for patients who get infections 4 or more times yearly requiring antibiotics, or for infections that do not clear with antibiotics.  It will not cure nasal allergies, so patients with allergies may still require medication to treat their allergies after surgery. Surgery may improve headaches related to sinusitis, however, some people will continue to  require medication to control sinus headaches related to allergies.  Surgery will do nothing for other forms of headache (migraine, tension or cluster). ° °What Are the Risks of Endoscopic Sinus Surgery? ° Current techniques allow surgery to be performed safely with little risk, however, there are rare complications that patients should be aware of.  Because the sinuses are located around the eyes, there is risk of eye injury, including blindness, though again, this would be quite rare. This is usually a result of bleeding behind the eye during surgery, which puts the vision oat risk, though there are treatments to protect the vision and prevent permanent disrupted by surgery causing a leak of the spinal fluid that surrounds the brain.  More serious complications would include bleeding inside the brain cavity or damage to the brain.  Again, all of these complications are uncommon, and spinal fluid leaks can be safely managed surgically if they occur.  The most common complication of sinus surgery is bleeding from the nose, which may require packing or cauterization of the nose.  Continued sinus have polyps may experience recurrence of the polyps requiring revision surgery.  Alterations of sense of smell or injury to the tear ducts are also rare complications.  ° °What is the Surgery Like, and what is the Recovery? ° The Surgery usually takes a couple of hours to perform, and is usually performed under a general anesthetic (completely asleep).  Patients are usually discharged home after a couple of hours.  Sometimes during surgery it is necessary to pack the nose to control bleeding, and the packing is left in place for 24 - 48 hours, and removed by your surgeon.    If a septoplasty was performed during the procedure, there is often a splint placed which must be removed after 5-7 days.   °Discomfort: Pain is usually mild to moderate, and can be controlled by prescription pain medication or acetaminophen (Tylenol).   Aspirin, Ibuprofen (Advil, Motrin), or Naprosyn (Aleve) should be avoided, as they can cause increased bleeding.  Most patients feel sinus pressure like they have a bad head cold for several days.  Sleeping with your head elevated can help reduce swelling and facial pressure, as can ice packs over the face.  A humidifier may be helpful to keep the mucous and blood from drying in the nose.  ° °Diet: There are no specific diet restrictions, however, you should generally start with clear liquids and a light diet of bland foods because the anesthetic can cause some nausea.  Advance your diet depending on how your stomach feels.  Taking your pain medication with food will often help reduce stomach upset which pain medications can cause. ° °Nasal Saline Irrigation: It is important to remove blood clots and dried mucous from the nose as it is healing.  This is done by having you irrigate the nose at least 3 - 4 times daily with a salt water solution.  We recommend using NeilMed Sinus Rinse (available at the drug store).  Fill the squeeze bottle with the solution, bend over a sink, and insert the tip of the squeeze bottle into the nose ½ of an inch.  Point the tip of the squeeze bottle towards the inside corner of the eye on the same side your irrigating.  Squeeze the bottle and gently irrigate the nose.  If you bend forward as you do this, most of the fluid will flow back out of the nose, instead of down your throat.   The solution should be warm, near body temperature, when you irrigate.   Each time you irrigate, you should use a full squeeze bottle.  ° °Note that if you are instructed to use Nasal Steroid Sprays at any time after your surgery, irrigate with saline BEFORE using the steroid spray, so you do not wash it all out of the nose. °Another product, Nasal Saline Gel (such as AYR Nasal Saline Gel) can be applied in each nostril 3 - 4 times daily to moisture the nose and reduce scabbing or crusting. ° °Bleeding:   Bloody drainage from the nose can be expected for several days, and patients are instructed to irrigate their nose frequently with salt water to help remove mucous and blood clots.  The drainage may be dark red or brown, though some fresh blood may be seen intermittently, especially after irrigation.  Do not blow you nose, as bleeding may occur. If you must sneeze, keep your mouth open to allow air to escape through your mouth. ° °If heavy bleeding occurs: Irrigate the nose with saline to rinse out clots, then spray the nose 3 - 4 times with Afrin Nasal Decongestant Spray.  The spray will constrict the blood vessels to slow bleeding.  Pinch the lower half of your nose shut to apply pressure, and lay down with your head elevated.  Ice packs over the nose may help as well. If bleeding persists despite these measures, you should notify your doctor.  Do not use the Afrin routinely to control nasal congestion after surgery, as it can result in worsening congestion and may affect healing.  ° ° ° °Activity: Return to work varies among patients. Most patients will be   out of work at least 5 - 7 days to recover.  Patient may return to work after they are off of narcotic pain medication, and feeling well enough to perform the functions of their job.  Patients must avoid heavy lifting (over 10 pounds) or strenuous physical for 2 weeks after surgery, so your employer may need to assign you to light duty, or keep you out of work longer if light duty is not possible.  NOTE: you should not drive, operate dangerous machinery, do any mentally demanding tasks or make any important legal or financial decisions while on narcotic pain medication and recovering from the general anesthetic.  °  °Call Your Doctor Immediately if You Have Any of the Following: °1. Bleeding that you cannot control with the above measures °2. Loss of vision, double vision, bulging of the eye or black eyes. °3. Fever over 101 degrees °4. Neck stiffness with  severe headache, fever, nausea and change in mental state. °You are always encourage to call anytime with concerns, however, please call with requests for pain medication refills during office hours. ° °Office Endoscopy: During follow-up visits your doctor will remove any packing or splints that may have been placed and evaluate and clean your sinuses endoscopically.  Topical anesthetic will be used to make this as comfortable as possible, though you may want to take your pain medication prior to the visit.  How often this will need to be done varies from patient to patient.  After complete recovery from the surgery, you may need follow-up endoscopy from time to time, particularly if there is concern of recurrent infection or nasal polyps. ° ° °General Anesthesia, Adult, Care After °These instructions provide you with information about caring for yourself after your procedure. Your health care provider may also give you more specific instructions. Your treatment has been planned according to current medical practices, but problems sometimes occur. Call your health care provider if you have any problems or questions after your procedure. °What can I expect after the procedure? °After the procedure, it is common to have: °· Vomiting. °· A sore throat. °· Mental slowness. ° °It is common to feel: °· Nauseous. °· Cold or shivery. °· Sleepy. °· Tired. °· Sore or achy, even in parts of your body where you did not have surgery. ° °Follow these instructions at home: °For at least 24 hours after the procedure: °· Do not: °? Participate in activities where you could fall or become injured. °? Drive. °? Use heavy machinery. °? Drink alcohol. °? Take sleeping pills or medicines that cause drowsiness. °? Make important decisions or sign legal documents. °? Take care of children on your own. °· Rest. °Eating and drinking °· If you vomit, drink water, juice, or soup when you can drink without vomiting. °· Drink enough fluid to  keep your urine clear or pale yellow. °· Make sure you have little or no nausea before eating solid foods. °· Follow the diet recommended by your health care provider. °General instructions °· Have a responsible adult stay with you until you are awake and alert. °· Return to your normal activities as told by your health care provider. Ask your health care provider what activities are safe for you. °· Take over-the-counter and prescription medicines only as told by your health care provider. °· If you smoke, do not smoke without supervision. °· Keep all follow-up visits as told by your health care provider. This is important. °Contact a health care provider if: °· You   continue to have nausea or vomiting at home, and medicines are not helpful. °· You cannot drink fluids or start eating again. °· You cannot urinate after 8-12 hours. °· You develop a skin rash. °· You have fever. °· You have increasing redness at the site of your procedure. °Get help right away if: °· You have difficulty breathing. °· You have chest pain. °· You have unexpected bleeding. °· You feel that you are having a life-threatening or urgent problem. °This information is not intended to replace advice given to you by your health care provider. Make sure you discuss any questions you have with your health care provider. °Document Released: 03/12/2001 Document Revised: 05/08/2016 Document Reviewed: 11/18/2015 °Elsevier Interactive Patient Education © 2018 [employer] ° °

## 2018-03-20 ENCOUNTER — Ambulatory Visit
Admission: RE | Admit: 2018-03-20 | Discharge: 2018-03-20 | Disposition: A | Discharge status: Home / Self Care | Payer: 59 | Source: Ambulatory Visit | Attending: Otolaryngology | Admitting: Otolaryngology

## 2018-03-20 ENCOUNTER — Encounter
Admission: RE | Disposition: A | Discharge status: Home / Self Care | Payer: Self-pay | Source: Ambulatory Visit | Attending: Otolaryngology

## 2018-03-20 ENCOUNTER — Ambulatory Visit: Payer: 59 | Admitting: Anesthesiology

## 2018-03-20 DIAGNOSIS — J342 Deviated nasal septum: Secondary | ICD-10-CM | POA: Diagnosis not present

## 2018-03-20 DIAGNOSIS — J343 Hypertrophy of nasal turbinates: Secondary | ICD-10-CM | POA: Diagnosis not present

## 2018-03-20 DIAGNOSIS — J329 Chronic sinusitis, unspecified: Secondary | ICD-10-CM | POA: Diagnosis not present

## 2018-03-20 DIAGNOSIS — J320 Chronic maxillary sinusitis: Secondary | ICD-10-CM | POA: Diagnosis not present

## 2018-03-20 DIAGNOSIS — F909 Attention-deficit hyperactivity disorder, unspecified type: Secondary | ICD-10-CM | POA: Insufficient documentation

## 2018-03-20 DIAGNOSIS — Z6836 Body mass index (BMI) 36.0-36.9, adult: Secondary | ICD-10-CM | POA: Diagnosis not present

## 2018-03-20 DIAGNOSIS — F419 Anxiety disorder, unspecified: Secondary | ICD-10-CM | POA: Insufficient documentation

## 2018-03-20 DIAGNOSIS — E282 Polycystic ovarian syndrome: Secondary | ICD-10-CM | POA: Insufficient documentation

## 2018-03-20 DIAGNOSIS — J45990 Exercise induced bronchospasm: Secondary | ICD-10-CM | POA: Diagnosis not present

## 2018-03-20 DIAGNOSIS — K219 Gastro-esophageal reflux disease without esophagitis: Secondary | ICD-10-CM | POA: Diagnosis not present

## 2018-03-20 DIAGNOSIS — Z881 Allergy status to other antibiotic agents status: Secondary | ICD-10-CM | POA: Diagnosis not present

## 2018-03-20 HISTORY — PX: IMAGE GUIDED SINUS SURGERY: SHX6570

## 2018-03-20 HISTORY — PX: NASAL SEPTOPLASTY W/ TURBINOPLASTY: SHX2070

## 2018-03-20 HISTORY — PX: MAXILLARY ANTROSTOMY: SHX2003

## 2018-03-20 SURGERY — SINUS SURGERY, WITH IMAGING GUIDANCE
Anesthesia: General | Site: Nose | Laterality: Left | Wound class: "Clean Contaminated "

## 2018-03-20 MED ORDER — LIDOCAINE HCL (CARDIAC) 20 MG/ML IV SOLN
INTRAVENOUS | Status: DC | PRN
  Administered 2018-03-20: 40 mg via INTRAVENOUS

## 2018-03-20 MED ORDER — DEXMEDETOMIDINE HCL 200 MCG/2ML IV SOLN
INTRAVENOUS | Status: DC | PRN
  Administered 2018-03-20: 4 ug via INTRAVENOUS
  Administered 2018-03-20 (×2): 8 ug via INTRAVENOUS

## 2018-03-20 MED ORDER — SCOPOLAMINE 1 MG/3DAYS TD PT72
1.0000 | MEDICATED_PATCH | TRANSDERMAL | Status: DC
  Administered 2018-03-20: 1.5 mg via TRANSDERMAL

## 2018-03-20 MED ORDER — LIDOCAINE-EPINEPHRINE 1 %-1:100000 IJ SOLN
INTRAMUSCULAR | Status: DC | PRN
  Administered 2018-03-20: 7 mL

## 2018-03-20 MED ORDER — ACETAMINOPHEN 10 MG/ML IV SOLN
1000.0000 mg | Freq: Once | INTRAVENOUS | Status: AC
  Administered 2018-03-20: 1000 mg via INTRAVENOUS

## 2018-03-20 MED ORDER — LACTATED RINGERS IV SOLN
INTRAVENOUS | Status: DC
  Administered 2018-03-20: 08:00:00 via INTRAVENOUS

## 2018-03-20 MED ORDER — SUCCINYLCHOLINE CHLORIDE 20 MG/ML IJ SOLN
INTRAMUSCULAR | Status: DC | PRN
  Administered 2018-03-20: 100 mg via INTRAVENOUS

## 2018-03-20 MED ORDER — DEXAMETHASONE SODIUM PHOSPHATE 4 MG/ML IJ SOLN
INTRAMUSCULAR | Status: DC | PRN
  Administered 2018-03-20: 10 mg via INTRAVENOUS

## 2018-03-20 MED ORDER — AMOXICILLIN-POT CLAVULANATE 875-125 MG PO TABS: 1.0000 | ORAL_TABLET | Freq: Two times a day (BID) | ORAL | 0 refills | Status: DC

## 2018-03-20 MED ORDER — MIDAZOLAM HCL 5 MG/5ML IJ SOLN
INTRAMUSCULAR | Status: DC | PRN
  Administered 2018-03-20: 2 mg via INTRAVENOUS

## 2018-03-20 MED ORDER — OXYCODONE HCL 5 MG PO TABS
5.0000 mg | ORAL_TABLET | Freq: Once | ORAL | Status: AC | PRN
  Administered 2018-03-20: 5 mg via ORAL

## 2018-03-20 MED ORDER — FENTANYL CITRATE (PF) 100 MCG/2ML IJ SOLN
25.0000 ug | INTRAMUSCULAR | Status: DC | PRN
  Administered 2018-03-20 (×2): 25 ug via INTRAVENOUS

## 2018-03-20 MED ORDER — OXYCODONE-ACETAMINOPHEN 5-325 MG PO TABS: 2.0000 | ORAL_TABLET | ORAL | 0 refills | Status: AC | PRN

## 2018-03-20 MED ORDER — PROPOFOL 10 MG/ML IV BOLUS
INTRAVENOUS | Status: DC | PRN
  Administered 2018-03-20: 30 mg via INTRAVENOUS
  Administered 2018-03-20: 70 mg via INTRAVENOUS
  Administered 2018-03-20: 200 mg via INTRAVENOUS

## 2018-03-20 MED ORDER — ALBUTEROL SULFATE (2.5 MG/3ML) 0.083% IN NEBU
2.5000 mg | INHALATION_SOLUTION | Freq: Once | RESPIRATORY_TRACT | Status: AC
  Administered 2018-03-20: 2.5 mg via RESPIRATORY_TRACT

## 2018-03-20 MED ORDER — ONDANSETRON HCL 4 MG PO TABS: 4.0000 mg | ORAL_TABLET | Freq: Three times a day (TID) | ORAL | 0 refills | Status: DC | PRN

## 2018-03-20 MED ORDER — OXYCODONE HCL 5 MG/5ML PO SOLN: 5.0000 mg | Freq: Once | ORAL | Status: AC | PRN

## 2018-03-20 MED ORDER — ONDANSETRON HCL 4 MG/2ML IJ SOLN
INTRAMUSCULAR | Status: DC | PRN
  Administered 2018-03-20 (×2): 4 mg via INTRAVENOUS

## 2018-03-20 MED ORDER — FENTANYL CITRATE (PF) 100 MCG/2ML IJ SOLN
INTRAMUSCULAR | Status: DC | PRN
  Administered 2018-03-20: 25 ug via INTRAVENOUS
  Administered 2018-03-20: 100 ug via INTRAVENOUS

## 2018-03-20 MED ORDER — OXYMETAZOLINE HCL 0.05 % NA SOLN
NASAL | Status: DC | PRN
  Administered 2018-03-20: 1 via TOPICAL

## 2018-03-20 SURGICAL SUPPLY — 37 items
BATTERY INSTRU NAVIGATION (MISCELLANEOUS) ×14 IMPLANT
CANISTER SUCT 1200ML W/VALVE (MISCELLANEOUS) ×4 IMPLANT
COAG SUCT 10F 3.5MM HAND CTRL (MISCELLANEOUS) ×4 IMPLANT
DRAPE HEAD BAR (DRAPES) ×2 IMPLANT
DRESSING NASL FOAM PST OP SINU (MISCELLANEOUS) IMPLANT
DRSG NASAL FOAM POST OP SINU (MISCELLANEOUS)
ELECT REM PT RETURN 9FT ADLT (ELECTROSURGICAL) ×4
ELECTRODE REM PT RTRN 9FT ADLT (ELECTROSURGICAL) ×2 IMPLANT
GLOVE BIO SURGEON STRL SZ7.5 (GLOVE) ×10 IMPLANT
IV NS 500ML (IV SOLUTION) ×2
IV NS 500ML BAXH (IV SOLUTION) ×2 IMPLANT
KIT TURNOVER KIT A (KITS) ×4 IMPLANT
NDL HYPO 25GX1X1/2 BEV (NEEDLE) ×2 IMPLANT
NEEDLE HYPO 25GX1X1/2 BEV (NEEDLE) ×4 IMPLANT
NS IRRIG 500ML POUR BTL (IV SOLUTION) ×4 IMPLANT
PACK DRAPE NASAL/ENT (PACKS) ×4 IMPLANT
PACKING NASAL EPIS 4X2.4 XEROG (MISCELLANEOUS) IMPLANT
PATTIES SURGICAL .5 X3 (DISPOSABLE) ×4 IMPLANT
SHAVER DIEGO BLD STD TYPE A (BLADE) ×4 IMPLANT
SOL ANTI-FOG 6CC FOG-OUT (MISCELLANEOUS) ×2 IMPLANT
SOL FOG-OUT ANTI-FOG 6CC (MISCELLANEOUS) ×2
SPLINT NASAL .50MM LRG (MISCELLANEOUS) ×2 IMPLANT
SPLINT NASAL SEPTAL BLV .50 ST (MISCELLANEOUS) ×2 IMPLANT
STRAP BODY AND KNEE 60X3 (MISCELLANEOUS) ×4 IMPLANT
SUT CHROMIC 4 0 RB 1X27 (SUTURE) ×4 IMPLANT
SUT ETHILON 3-0 FS-10 30 BLK (SUTURE)
SUT ETHILON 4-0 (SUTURE) ×2
SUT ETHILON 4-0 FS2 18XMFL BLK (SUTURE) ×2
SUT PLAIN GUT 4-0 (SUTURE) ×2 IMPLANT
SUTURE EHLN 3-0 FS-10 30 BLK (SUTURE) ×2 IMPLANT
SUTURE ETHLN 4-0 FS2 18XMF BLK (SUTURE) IMPLANT
SYR BULB 3OZ (MISCELLANEOUS) ×2 IMPLANT
SYRINGE 10CC LL (SYRINGE) ×4 IMPLANT
TOWEL OR 17X26 4PK STRL BLUE (TOWEL DISPOSABLE) ×4 IMPLANT
TRACKER CRANIALMASK (MASK) ×4 IMPLANT
TUBING DECLOG MULTIDEBRIDER (TUBING) ×4 IMPLANT
WATER STERILE IRR 250ML POUR (IV SOLUTION) ×2 IMPLANT

## 2018-03-20 NOTE — H&P (Signed)
..  History and Physical paper copy reviewed and updated date of procedure and will be scanned into system.  Patient seen and examined.  

## 2018-03-20 NOTE — Transfer of Care (Signed)
Immediate Anesthesia Transfer of Care Note  Patient: Heather Ferguson  Procedure(s) Performed: IMAGE GUIDED SINUS SURGERY (Bilateral Nose) MAXILLARY ANTROSTOMY WITHOUT TISSUE (Left Nose) NASAL SEPTOPLASTY WITH TURBINATE REDUCTION (Bilateral Nose)  Patient Location: PACU  Anesthesia Type: General  Level of Consciousness: awake, alert  and patient cooperative  Airway and Oxygen Therapy: Patient Spontanous Breathing and Patient connected to supplemental oxygen  Post-op Assessment: Post-op Vital signs reviewed, Patient's Cardiovascular Status Stable, Respiratory Function Stable, Patent Airway and No signs of Nausea or vomiting  Post-op Vital Signs: Reviewed and stable  Complications: No apparent anesthesia complications

## 2018-03-20 NOTE — Anesthesia Preprocedure Evaluation (Signed)
Anesthesia Evaluation  Patient identified by MRN, date of birth, ID band  Reviewed: NPO status   History of Anesthesia Complications (+) PONV and history of anesthetic complications  Airway Mallampati: II  TM Distance: >3 FB Neck ROM: full    Dental no notable dental hx.    Pulmonary asthma (exercise) ,    Pulmonary exam normal        Cardiovascular negative cardio ROS Normal cardiovascular exam     Neuro/Psych Anxiety adhd negative neurological ROS  negative psych ROS   GI/Hepatic Neg liver ROS, GERD  Controlled,  Endo/Other  Morbid obesity (bmi=36)  Renal/GU negative Renal ROS   pcos    Musculoskeletal   Abdominal   Peds  Hematology negative hematology ROS (+)   Anesthesia Other Findings   Reproductive/Obstetrics negative OB ROS                             Anesthesia Physical Anesthesia Plan  ASA: II  Anesthesia Plan: General   Post-op Pain Management:    Induction:   PONV Risk Score and Plan:   Airway Management Planned:   Additional Equipment:   Intra-op Plan:   Post-operative Plan:   Informed Consent: I have reviewed the patients History and Physical, chart, labs and discussed the procedure including the risks, benefits and alternatives for the proposed anesthesia with the patient or authorized representative who has indicated his/her understanding and acceptance.     Plan Discussed with: CRNA  Anesthesia Plan Comments:         Anesthesia Quick Evaluation

## 2018-03-20 NOTE — Anesthesia Postprocedure Evaluation (Signed)
Anesthesia Post Note  Patient: Heather Ferguson  Procedure(s) Performed: IMAGE GUIDED SINUS SURGERY (Bilateral Nose) MAXILLARY ANTROSTOMY WITHOUT TISSUE (Left Nose) NASAL SEPTOPLASTY WITH TURBINATE REDUCTION (Bilateral Nose)  Patient location during evaluation: PACU Anesthesia Type: General Level of consciousness: awake and alert Pain management: pain level controlled Vital Signs Assessment: post-procedure vital signs reviewed and stable Respiratory status: spontaneous breathing, nonlabored ventilation, respiratory function stable and patient connected to nasal cannula oxygen Cardiovascular status: blood pressure returned to baseline and stable Postop Assessment: no apparent nausea or vomiting Anesthetic complications: no   In pacu, sat's in low 90's.  Pt received albuterol nebulizer. Sats improved with incentive spirometry. Pt encouraged to take deep breaths via mouth.   Orrin BrighamMaji, Alaric Gladwin

## 2018-03-20 NOTE — Anesthesia Procedure Notes (Signed)
Procedure Name: Intubation Date/Time: 03/20/2018 8:17 AM Performed by: Janna Arch, CRNA Pre-anesthesia Checklist: Patient identified, Emergency Drugs available, Suction available and Patient being monitored Patient Re-evaluated:Patient Re-evaluated prior to induction Oxygen Delivery Method: Circle system utilized Preoxygenation: Pre-oxygenation with 100% oxygen Induction Type: IV induction Ventilation: Mask ventilation without difficulty Laryngoscope Size: Mac and 3 Grade View: Grade I Tube type: Oral Rae Tube size: 7.5 mm Number of attempts: 1 Airway Equipment and Method: Patient positioned with wedge pillow and Stylet Secured at: 21 cm Tube secured with: Tape Dental Injury: Teeth and Oropharynx as per pre-operative assessment

## 2018-03-20 NOTE — Op Note (Signed)
..03/20/2018  10:12 AM    Heather Ferguson  [id number]    Pre-Op Dx:  Deviated Nasal Septum, Hypertrophic Inferior Turbinates, chronic maxillary rhinosinusitis  Post-op Dx: Same  Proc: 1)  Left maxillary antrostomy  2)Nasal Septoplasty  3) Bilateral Partial Reduction Inferior Turbinates  4)  IGSS   Surg:  Heli Dino  Anes:  GOT  EBL:  50ml  Comp:  none  Findings: severe left sided cartilage and bone deviation with impaction on middle and inferior turbinate.  Bilateral inferior bone and soft tissue turbinate hypertrophy, Left large maxillary antrostomy  Procedure: With the patient in a comfortable supine position,  general orotracheal anesthesia was induced without difficulty.  The patient received preoperative Afrin spray for topical decongestion and vasoconstriction.  At an appropriate level, the patient was placed in a semi-sitting position.  Nasal vibrissae were trimmed.   1% Xylocaine with 1:100,000 epinephrine, 7 cc's, was infiltrated into the anterior floor of the nose, into the nasal spine region, into the membranous columella, and finally into the submucoperichondrial plane of the septum on both sides.  Several minutes were allowed for this to take effect.  Cottoniod pledgetts soaked in Afrin were placed into both nasal cavities and left while the patient was prepped and draped in the standard fashion.   A proper time-out was performed.  The Stryker image guidance system was set up and calibrated in the normal fashion with an acceptable error of 0.466mm.   The materials were removed from the nose and observed to be intact and correct in number.  The nose was inspected with a headlight and zero degree endoscope with the findings as described above.  A left Killian incision was sharply executed and carried down to the caudal edge of the quadrangular cartilage with a 15 blade scapel.  A mucoperichondrial flap was elelvated along the quadrangular plate back to the  bony-cartilaginous junction using caudal elevator and freer elevator. The mucoperiostium was then elevated along the ethmoid plate and the vomer. An itracartilagenous incision was made using the freer elevator and a contralateral mucoperichondiral flap was elevated using a freer elevator.  Care was taken to avoid any large rents or opposing rents in the mucoperichondrial flap.  Boney spurs of the vomer and maxillary crest were removed with Takahashi forceps.  The area of cartilagenous deviation was removed with combination of freer elevator and Takahashi forceps creating a widely patent nasal cavity as well as resolution of obstruction from the cartilagenous deviation. The mucosal flaps were placed back into their anatomic position to allow visualization of the airways. The septum now sat in the midline with an improved airway.  A 4-0 Chromic was used to close the JennetteKillian incision as well.   The inferior turbinates were then inspected.  Under endoscopic visualization, the inferior turbinates were infractured bilaterally with a Therapist, nutritionalreer elevator.  A kelly clamp was attached to the anterior-inferior third of each inferior turbinate for approximately one minute.  Under endoscopic visualization, Tru-cutting forceps were used to remove the anterior-inferior third of each inferior turbinate.  Electrocautery was used to control bleeding in the area. The remaining turbinate was then outfractured to open up the airway further. There was no significant bleeding noted. The right turbinate was then trimmed and outfractured in a similar fashion.  The airways were then visualized and showed open passageways on both sides that were significantly improved compared to before surgery.       At this point, attention was directed to the functional endoscopic sinus surgery  aspect of the procedure.  The nose was next inspected with a zero degree endoscope and the left middle turbinate was medialized and afrin soaked pledgets were  placed lateral to the turbinate for approximately one minute.  The uncinate process was infractured with a maxillary ostia seeker.  The uncinate was removed with backbiting forceps.   On the left, a ball tipped probe was placed through the natural ostia and this was used to create a larger opening.  Using a Diego microdebrider, the maxillary antrostomy was enlarged for a widely patent maxillary antrostomy.  Hemostasis was performed with topical Afrin soaked pledgets.  Visualization with a zero degree endoscope was used to examine antrostomy which was noted to be widely patent and in continuity with the natural os.  A xerogel sponge was placed lateral to the middle turbinate on the left side.  There was no signifcant bleeding. Nasal splints were applied to both sides of the septum using Xomed 0.6mm regular sized splints that were trimmed, and then held in position with a 3-0 Nylon through and through suture.  Stamberger sinufoam was placed along the cut edge of the inferior turbinates bilaterally and the maxillary antrostomy.  The patient was turned back over to anesthesia, and awakened, extubated, and taken to the PACU in satisfactory condition.  Dispo:   PACU to home  Plan: Ice, elevation, narcotic analgesia, steroid taper, and prophylactic antibiotics for the duration of indwelling nasal foreign bodies.  We will reevaluate the patient in the office in 6 days and remove the septal splints.  Return to work in 10 days, strenuous activities in two weeks.   Heather Ferguson 03/20/2018 10:12 AM

## 2018-03-21 ENCOUNTER — Encounter: Payer: Self-pay | Admitting: Otolaryngology

## 2018-04-04 ENCOUNTER — Encounter: Payer: Self-pay | Admitting: Obstetrics and Gynecology

## 2018-04-08 ENCOUNTER — Other Ambulatory Visit: Payer: Self-pay | Admitting: *Deleted

## 2018-04-08 MED ORDER — FLUCONAZOLE 150 MG PO TABS: 150.0000 mg | ORAL_TABLET | Freq: Once | ORAL | 2 refills | Status: AC

## 2018-04-09 DIAGNOSIS — J320 Chronic maxillary sinusitis: Secondary | ICD-10-CM | POA: Diagnosis not present

## 2018-04-09 DIAGNOSIS — J342 Deviated nasal septum: Secondary | ICD-10-CM | POA: Diagnosis not present

## 2018-05-03 ENCOUNTER — Other Ambulatory Visit: Payer: Self-pay | Admitting: Obstetrics and Gynecology

## 2018-06-03 ENCOUNTER — Encounter: Payer: Self-pay | Admitting: Obstetrics and Gynecology

## 2018-06-03 ENCOUNTER — Other Ambulatory Visit: Payer: Self-pay

## 2018-06-04 ENCOUNTER — Encounter: Payer: Self-pay | Admitting: Obstetrics and Gynecology

## 2018-06-05 ENCOUNTER — Other Ambulatory Visit: Payer: Self-pay

## 2018-06-05 MED ORDER — PANTOPRAZOLE SODIUM 40 MG PO TBEC: 40.0000 mg | DELAYED_RELEASE_TABLET | Freq: Every day | ORAL | 3 refills | Status: DC

## 2018-06-28 ENCOUNTER — Other Ambulatory Visit: Payer: Self-pay | Admitting: Obstetrics and Gynecology

## 2018-06-28 ENCOUNTER — Encounter: Payer: Self-pay | Admitting: Obstetrics and Gynecology

## 2018-07-09 ENCOUNTER — Encounter: Payer: Self-pay | Admitting: Obstetrics and Gynecology

## 2018-07-09 ENCOUNTER — Ambulatory Visit (INDEPENDENT_AMBULATORY_CARE_PROVIDER_SITE_OTHER): Payer: 59 | Admitting: Obstetrics and Gynecology

## 2018-07-09 VITALS — Ht 66.0 in | Wt 229.7 lb

## 2018-07-09 DIAGNOSIS — Z01419 Encounter for gynecological examination (general) (routine) without abnormal findings: Secondary | ICD-10-CM | POA: Diagnosis not present

## 2018-07-09 NOTE — Patient Instructions (Signed)
Preventive Care 18-39 Years, Female Preventive care refers to lifestyle choices and visits with your health care provider that can promote health and wellness. What does preventive care include?  A yearly physical exam. This is also called an annual well check.  Dental exams once or twice a year.  Routine eye exams. Ask your health care provider how often you should have your eyes checked.  Personal lifestyle choices, including: ? Daily care of your teeth and gums. ? Regular physical activity. ? Eating a healthy diet. ? Avoiding tobacco and drug use. ? Limiting alcohol use. ? Practicing safe sex. ? Taking vitamin and mineral supplements as recommended by your health care provider. What happens during an annual well check? The services and screenings done by your health care provider during your annual well check will depend on your age, overall health, lifestyle risk factors, and family history of disease. Counseling Your health care provider may ask you questions about your:  Alcohol use.  Tobacco use.  Drug use.  Emotional well-being.  Home and relationship well-being.  Sexual activity.  Eating habits.  Work and work Statistician.  Method of birth control.  Menstrual cycle.  Pregnancy history.  Screening You may have the following tests or measurements:  Height, weight, and BMI.  Diabetes screening. This is done by checking your blood sugar (glucose) after you have not eaten for a while (fasting).  Blood pressure.  Lipid and cholesterol levels. These may be checked every 5 years starting at age 38.  Skin check.  Hepatitis C blood test.  Hepatitis B blood test.  Sexually transmitted disease (STD) testing.  BRCA-related cancer screening. This may be done if you have a family history of breast, ovarian, tubal, or peritoneal cancers.  Pelvic exam and Pap test. This may be done every 3 years starting at age 38. Starting at age 30, this may be done  every 5 years if you have a Pap test in combination with an HPV test.  Discuss your test results, treatment options, and if necessary, the need for more tests with your health care provider. Vaccines Your health care provider may recommend certain vaccines, such as:  Influenza vaccine. This is recommended every year.  Tetanus, diphtheria, and acellular pertussis (Tdap, Td) vaccine. You may need a Td booster every 10 years.  Varicella vaccine. You may need this if you have not been vaccinated.  HPV vaccine. If you are 39 or younger, you may need three doses over 6 months.  Measles, mumps, and rubella (MMR) vaccine. You may need at least one dose of MMR. You may also need a second dose.  Pneumococcal 13-valent conjugate (PCV13) vaccine. You may need this if you have certain conditions and were not previously vaccinated.  Pneumococcal polysaccharide (PPSV23) vaccine. You may need one or two doses if you smoke cigarettes or if you have certain conditions.  Meningococcal vaccine. One dose is recommended if you are age 68-21 years and a first-year college student living in a residence hall, or if you have one of several medical conditions. You may also need additional booster doses.  Hepatitis A vaccine. You may need this if you have certain conditions or if you travel or work in places where you may be exposed to hepatitis A.  Hepatitis B vaccine. You may need this if you have certain conditions or if you travel or work in places where you may be exposed to hepatitis B.  Haemophilus influenzae type b (Hib) vaccine. You may need this  if you have certain risk factors.  Talk to your health care provider about which screenings and vaccines you need and how often you need them. This information is not intended to replace advice given to you by your health care provider. Make sure you discuss any questions you have with your health care provider. Document Released: 01/30/2002 Document Revised:  08/23/2016 Document Reviewed: 10/05/2015 Elsevier Interactive Patient Education  2018 Elsevier Inc.  

## 2018-07-09 NOTE — Progress Notes (Signed)
Subjective:   Sindia Kowalczyk is a 37 y.o. G0P0000 Caucasian female here for a routine well-woman exam.  Patient's last menstrual period was 06/26/2018.    Current complaints: desires pregnancy in near future, history of PCOS. 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  Past Medical History Past Medical History:  Diagnosis Date  . ADHD   . Anxiety   . Asthma    exercise induced  . GERD (gastroesophageal reflux disease)   . PCOS (polycystic ovarian syndrome)   . PONV (postoperative nausea and vomiting)    after ganglion cyst removal    Past Surgical History Past Surgical History:  Procedure Laterality Date  . COLONOSCOPY N/A 04/30/2015   Procedure: COLONOSCOPY;  Surgeon: Scot Jun, MD;  Location: [facility] ENDOSCOPY;  Service: Endoscopy;  Laterality: N/A;  . ESOPHAGOGASTRODUODENOSCOPY N/A 04/30/2015   Procedure: ESOPHAGOGASTRODUODENOSCOPY (EGD);  Surgeon: Scot Jun, MD;  Location: [facility] ENDOSCOPY;  Service: Endoscopy;  Laterality: N/A;  . FRACTURE SURGERY    . GANGLION CYST EXCISION  12/08/2010   finger  . IMAGE GUIDED SINUS SURGERY Bilateral 03/20/2018   Procedure: IMAGE GUIDED SINUS SURGERY;  Surgeon: Bud Face, MD;  Location: [facility] SURGERY [facility];  Service: ENT;  Laterality: Bilateral;  NEED STRYKER DISK gave disk  to brenda 3-14 kp  . MAXILLARY ANTROSTOMY Left 03/20/2018   Procedure: MAXILLARY ANTROSTOMY WITHOUT TISSUE;  Surgeon: Bud Face, MD;  Location: [facility] SURGERY CNTR;  Service: ENT;  Laterality: Left;  . NASAL  SEPTOPLASTY W/ TURBINOPLASTY Bilateral 03/20/2018   Procedure: NASAL SEPTOPLASTY WITH TURBINATE REDUCTION;  Surgeon: Bud Face, MD;  Location: [facility] SURGERY [facility]TR;  Service: ENT;  Laterality: Bilateral;    Gynecologic History G0P0000  Patient's last menstrual period was 06/26/2018. Contraception: none Last Pap: 2017. Results were: normal   Obstetric History OB History  Gravida Para Term Preterm AB Living  0 0 0 0 0 0  SAB TAB Ectopic Multiple Live Births  0 0 0 0 0    Current Medications Current Outpatient Medications on File Prior to Visit  Medication Sig Dispense Refill  . albuterol (PROVENTIL HFA;VENTOLIN HFA) 108 (90 Base) MCG/ACT inhaler Inhale into the lungs.    . ALPRAZolam (XANAX) 0.5 MG tablet Take 1 tablet (0.5 mg total) by mouth 3 (three) times daily as needed for anxiety. 45 tablet 2  . amphetamine-dextroamphetamine (ADDERALL) 10 MG tablet Take 10 mg by mouth 2 (two) times daily with a meal.    . budesonide-formoterol (SYMBICORT) 80-4.5 MCG/ACT inhaler Inhale 2 puffs into the lungs 2 (two) times daily as needed.    . busPIRone (BUSPAR) 10 MG tablet Take 10 mg by mouth 3 (three) times daily.    Marland Kitchen escitalopram (LEXAPRO) 20 MG tablet Take 20 mg by mouth daily. Pt is taking 30mg     . KURVELO 0.15-30 MG-MCG tablet TAKE 1 TABLET BY MOUTH ONCE DAILY 28 tablet 6  . metFORMIN (GLUCOPHAGE) 500 MG tablet Take 1 tablet (500 mg total) by mouth daily with breakfast. 90 tablet 2  . metFORMIN (GLUCOPHAGE) 850 MG tablet TAKE 1 TABLET BY MOUTH ONCE DAILY AT  [facility]  90 tablet 2  . pantoprazole (PROTONIX) 40 MG tablet Take 1 tablet (40 mg total) by mouth daily. 30 tablet 3  . Vitamin D, Ergocalciferol, (DRISDOL) 50000 units CAPS capsule TAKE ONE CAPSULE BY MOUTH EVERY 7 DAYS 4 capsule 14  . amoxicillin-clavulanate (AUGMENTIN) 875-125 MG tablet Take 1 tablet by mouth 2 (two) times daily. (Patient not taking: Reported on 07/09/2018) 20 tablet 0  . ondansetron (ZOFRAN) 4 MG tablet Take 1  tablet (4 mg total) by mouth every 8 (eight) hours as needed for up to 10 doses for nausea or vomiting. (Patient not taking: Reported on 07/09/2018) 20 tablet 0  . oxyCODONE-acetaminophen (PERCOCET) 5-325 MG tablet Take 2 tablets by mouth every 4 (four) hours as needed for severe pain. (Patient not taking: Reported on 07/09/2018) 40 tablet 0   No current facility-administered medications on file prior to visit.     Review of Systems Patient denies any headaches, blurred vision, shortness of breath, chest pain, abdominal pain, problems with bowel movements, urination, or intercourse.  Objective:  Ht 5\' 6"  (1.676 m)   Wt 229 lb 11.2 oz (104.2 kg)   LMP 06/26/2018   BMI 37.07 kg/m  Physical Exam  General:  Well developed, well nourished, no acute distress. She is alert and oriented x3. Skin:  Warm and dry Neck:  Midline trachea, no thyromegaly or nodules Cardiovascular: Regular rate and rhythm, no murmur heard Lungs:  Effort normal, all lung fields clear to auscultation bilaterally Breasts:  No dominant palpable mass, retraction, or nipple discharge Abdomen:  Soft, non tender, no hepatosplenomegaly or masses Pelvic:  External genitalia is normal in appearance.  The vagina is normal in appearance. The cervix is bulbous, no CMT.  Thin prep pap is not done . Uterus is felt to be normal size, shape, and contour.  No adnexal masses or tenderness noted. Extremities:  No swelling or varicosities noted Psych:  She has a normal mood and affect  Assessment:   Healthy well-woman exam obesity  Plan:  Labs obtained-will follow up accordingly. Desires pregnancy in near future- has potential sperm donor, encouraged weight loss. F/U 1 year for AE, or sooner if needed  Melody Suzan NailerN Shambley, CNM

## 2018-07-10 LAB — COMPREHENSIVE METABOLIC PANEL
ALBUMIN: 4.5 g/dL (ref 3.5–5.5)
ALT: 34 IU/L — ABNORMAL HIGH (ref 0–32)
AST: 24 IU/L (ref 0–40)
Albumin/Globulin Ratio: 1.5 (ref 1.2–2.2)
Alkaline Phosphatase: 46 IU/L (ref 39–117)
BUN / CREAT RATIO: 9 (ref 9–23)
BUN: 9 mg/dL (ref 6–20)
Bilirubin Total: 0.2 mg/dL (ref 0.0–1.2)
CO2: 19 mmol/L — AB (ref 20–29)
CREATININE: 1 mg/dL (ref 0.57–1.00)
Calcium: 9.5 mg/dL (ref 8.7–10.2)
Chloride: 102 mmol/L (ref 96–106)
GFR calc non Af Amer: 73 mL/min/{1.73_m2} (ref >59)
GFR, EST AFRICAN AMERICAN: 84 mL/min/{1.73_m2} (ref >59)
GLUCOSE: 76 mg/dL (ref 65–99)
Globulin, Total: 3 g/dL (ref 1.5–4.5)
Potassium: 4.1 mmol/L (ref 3.5–5.2)
Sodium: 137 mmol/L (ref 134–144)
TOTAL PROTEIN: 7.5 g/dL (ref 6.0–8.5)

## 2018-07-10 LAB — HEMOGLOBIN A1C
ESTIMATED AVERAGE GLUCOSE: 105 mg/dL
Hgb A1c MFr Bld: 5.3 % (ref 4.8–5.6)

## 2018-07-10 LAB — CBC
Hematocrit: 38.7 % (ref 34.0–46.6)
Hemoglobin: 12.9 g/dL (ref 11.1–15.9)
MCH: 26.7 pg (ref 26.6–33.0)
MCHC: 33.3 g/dL (ref 31.5–35.7)
MCV: 80 fL (ref 79–97)
PLATELETS: 258 10*3/uL (ref 150–450)
RBC: 4.84 x10E6/uL (ref 3.77–5.28)
RDW: 15.9 % — AB (ref 12.3–15.4)
WBC: 8.2 10*3/uL (ref 3.4–10.8)

## 2018-07-10 LAB — FERRITIN: Ferritin: 13 ng/mL — ABNORMAL LOW (ref 15–150)

## 2018-07-10 LAB — LIPID PANEL
Chol/HDL Ratio: 4.3 ratio (ref 0.0–4.4)
Cholesterol, Total: 204 mg/dL — ABNORMAL HIGH (ref 100–199)
HDL: 47 mg/dL (ref >39)
LDL Calculated: 125 mg/dL — ABNORMAL HIGH (ref 0–99)
TRIGLYCERIDES: 160 mg/dL — AB (ref 0–149)
VLDL Cholesterol Cal: 32 mg/dL (ref 5–40)

## 2018-07-10 LAB — VITAMIN D 25 HYDROXY (VIT D DEFICIENCY, FRACTURES): VIT D 25 HYDROXY: 62.5 ng/mL (ref 30.0–100.0)

## 2018-07-10 LAB — INSULIN, RANDOM: INSULIN: 18.9 u[IU]/mL (ref 2.6–24.9)

## 2018-09-04 ENCOUNTER — Other Ambulatory Visit: Payer: Self-pay | Admitting: Obstetrics and Gynecology

## 2018-12-16 DIAGNOSIS — J019 Acute sinusitis, unspecified: Secondary | ICD-10-CM | POA: Diagnosis not present

## 2018-12-20 ENCOUNTER — Other Ambulatory Visit: Payer: Self-pay | Admitting: Obstetrics and Gynecology

## 2018-12-20 DIAGNOSIS — E282 Polycystic ovarian syndrome: Secondary | ICD-10-CM

## 2018-12-20 NOTE — Progress Notes (Unsigned)
refer

## 2018-12-23 ENCOUNTER — Other Ambulatory Visit: Payer: Self-pay | Admitting: *Deleted

## 2018-12-23 MED ORDER — METFORMIN HCL 850 MG PO TABS: ORAL_TABLET | ORAL | 2 refills | Status: DC

## 2019-01-13 ENCOUNTER — Other Ambulatory Visit: Payer: Self-pay | Admitting: *Deleted

## 2019-01-13 MED ORDER — LEVONORGESTREL-ETHINYL ESTRAD 0.15-30 MG-MCG PO TABS: 1.0000 | ORAL_TABLET | Freq: Every day | ORAL | 6 refills | Status: DC

## 2019-01-20 ENCOUNTER — Other Ambulatory Visit: Payer: Self-pay | Admitting: Obstetrics and Gynecology

## 2019-04-30 ENCOUNTER — Other Ambulatory Visit: Payer: Self-pay | Admitting: Obstetrics and Gynecology

## 2019-04-30 DIAGNOSIS — Z119 Encounter for screening for infectious and parasitic diseases, unspecified: Secondary | ICD-10-CM

## 2019-05-23 ENCOUNTER — Other Ambulatory Visit: Payer: Self-pay | Admitting: Obstetrics and Gynecology

## 2019-07-11 ENCOUNTER — Encounter: Payer: 59 | Admitting: Obstetrics and Gynecology

## 2019-07-17 ENCOUNTER — Encounter: Payer: 59 | Admitting: Obstetrics and Gynecology

## 2019-08-21 ENCOUNTER — Other Ambulatory Visit: Payer: Self-pay | Admitting: Obstetrics and Gynecology

## 2019-09-15 ENCOUNTER — Other Ambulatory Visit: Payer: Self-pay

## 2019-09-15 MED ORDER — ALPRAZOLAM 0.5 MG PO TABS: 0.5000 mg | ORAL_TABLET | Freq: Three times a day (TID) | ORAL | 0 refills | Status: DC | PRN

## 2019-10-01 ENCOUNTER — Other Ambulatory Visit: Payer: Self-pay

## 2019-10-01 DIAGNOSIS — Z20822 Contact with and (suspected) exposure to covid-19: Secondary | ICD-10-CM

## 2019-10-03 LAB — NOVEL CORONAVIRUS, NAA: SARS-CoV-2, NAA: DETECTED — AB

## 2019-10-23 ENCOUNTER — Other Ambulatory Visit: Payer: Self-pay

## 2019-10-23 ENCOUNTER — Encounter: Payer: Self-pay | Admitting: Obstetrics and Gynecology

## 2019-10-23 ENCOUNTER — Ambulatory Visit (INDEPENDENT_AMBULATORY_CARE_PROVIDER_SITE_OTHER): Payer: Managed Care, Other (non HMO) | Admitting: Obstetrics and Gynecology

## 2019-10-23 VITALS — BP 114/61 | HR 102 | Ht 66.0 in | Wt 241.2 lb

## 2019-10-23 DIAGNOSIS — Z01419 Encounter for gynecological examination (general) (routine) without abnormal findings: Secondary | ICD-10-CM | POA: Diagnosis not present

## 2019-10-23 DIAGNOSIS — Z6838 Body mass index (BMI) 38.0-38.9, adult: Secondary | ICD-10-CM

## 2019-10-23 NOTE — Progress Notes (Signed)
Patient is here for an annual exam. Flu shot given.

## 2019-10-23 NOTE — Addendum Note (Signed)
Addended by: Joylene Igo on: 10/23/2019 04:35 PM   Modules accepted: Orders

## 2019-10-23 NOTE — Patient Instructions (Addendum)
 Preventive Care 21-36 Years Old, Female Preventive care refers to visits with your health care provider and lifestyle choices that can promote health and wellness. This includes:  A yearly physical exam. This may also be called an annual well check.  Regular dental visits and eye exams.  Immunizations.  Screening for certain conditions.  Healthy lifestyle choices, such as eating a healthy diet, getting regular exercise, not using drugs or products that contain nicotine and tobacco, and limiting alcohol use. What can I expect for my preventive care visit? Physical exam Your health care provider will check your:  Height and weight. This may be used to calculate body mass index (BMI), which tells if you are at a healthy weight.  Heart rate and blood pressure.  Skin for abnormal spots. Counseling Your health care provider may ask you questions about your:  Alcohol, tobacco, and drug use.  Emotional well-being.  Home and relationship well-being.  Sexual activity.  Eating habits.  Work and work environment.  Method of birth control.  Menstrual cycle.  Pregnancy history. What immunizations do I need?  Influenza (flu) vaccine  This is recommended every year. Tetanus, diphtheria, and pertussis (Tdap) vaccine  You may need a Td booster every 10 years. Varicella (chickenpox) vaccine  You may need this if you have not been vaccinated. Human papillomavirus (HPV) vaccine  If recommended by your health care provider, you may need three doses over 6 months. Measles, mumps, and rubella (MMR) vaccine  You may need at least one dose of MMR. You may also need a second dose. Meningococcal conjugate (MenACWY) vaccine  One dose is recommended if you are age 19-21 years and a first-year college student living in a residence hall, or if you have one of several medical conditions. You may also need additional booster doses. Pneumococcal conjugate (PCV13) vaccine  You may need  this if you have certain conditions and were not previously vaccinated. Pneumococcal polysaccharide (PPSV23) vaccine  You may need one or two doses if you smoke cigarettes or if you have certain conditions. Hepatitis A vaccine  You may need this if you have certain conditions or if you travel or work in places where you may be exposed to hepatitis A. Hepatitis B vaccine  You may need this if you have certain conditions or if you travel or work in places where you may be exposed to hepatitis B. Haemophilus influenzae type b (Hib) vaccine  You may need this if you have certain conditions. You may receive vaccines as individual doses or as more than one vaccine together in one shot (combination vaccines). Talk with your health care provider about the risks and benefits of combination vaccines. What tests do I need?  Blood tests  Lipid and cholesterol levels. These may be checked every 5 years starting at age 20.  Hepatitis C test.  Hepatitis B test. Screening  Diabetes screening. This is done by checking your blood sugar (glucose) after you have not eaten for a while (fasting).  Sexually transmitted disease (STD) testing.  BRCA-related cancer screening. This may be done if you have a family history of breast, ovarian, tubal, or peritoneal cancers.  Pelvic exam and Pap test. This may be done every 3 years starting at age 21. Starting at age 30, this may be done every 5 years if you have a Pap test in combination with an HPV test. Talk with your health care provider about your test results, treatment options, and if necessary, the need for more   tests. Follow these instructions at home: Eating and drinking   Eat a diet that includes fresh fruits and vegetables, whole grains, lean protein, and low-fat dairy.  Take vitamin and mineral supplements as recommended by your health care provider.  Do not drink alcohol if: ? Your health care provider tells you not to drink. ? You are  pregnant, may be pregnant, or are planning to become pregnant.  If you drink alcohol: ? Limit how much you have to 0-1 drink a day. ? Be aware of how much alcohol is in your drink. In the [place] one drink equals one 12 oz bottle of beer (355 mL), one 5 oz glass of wine (148 mL), or one 1 oz glass of hard liquor (44 mL). Lifestyle  Take daily care of your teeth and gums.  Stay active. Exercise for at least 30 minutes on 5 or more days each week.  Do not use any products that contain nicotine or tobacco, such as cigarettes, e-cigarettes, and chewing tobacco. If you need help quitting, ask your health care provider.  If you are sexually active, practice safe sex. Use a condom or other form of birth control (contraception) in order to prevent pregnancy and STIs (sexually transmitted infections). If you plan to become pregnant, see your health care provider for a preconception visit. What's next?  Visit your health care provider once a year for a well check visit.  Ask your health care provider how often you should have your eyes and teeth checked.  Stay up to date on all vaccines. This information is not intended to replace advice given to you by your health care provider. Make sure you discuss any questions you have with your health care provider. Document Released: 01/30/2002 Document Revised: 08/15/2018 Document Reviewed: 08/15/2018 Elsevier Patient Education  2020 [place] into intermittent fasting or [employer], also a Physiological scientist can be helpful. Don't get discouraged as it will happen.  Melody

## 2019-10-23 NOTE — Progress Notes (Signed)
Subjective:   Heather Ferguson is a 38 y.o. G0P0000 Caucasian female here for a routine well-woman exam.  Patient's last menstrual period was 10/18/2019 (exact date).    Current complaints: difficulty losing weight, did weight watchers for 10 months with no weight loss.  Still desires pregnancy in near future via AIS. Has been seen at [facility] infertility. PCP: none       does desire labs  Social History: Sexual: homosexual Marital Status: same sex partner Living situation: with partner / significant other Occupation: [employer] Tobacco/alcohol: no tobacco use Illicit drugs: no history of illicit drug use  The following portions of the patient's history were reviewed and updated as appropriate: allergies, current medications, past family history, past medical history, past social history, past surgical history and problem list.  Past Medical History Past Medical History:  Diagnosis Date  . ADHD   . Anxiety   . Asthma    exercise induced  . GERD (gastroesophageal reflux disease)   . PCOS (polycystic ovarian syndrome)   . PONV (postoperative nausea and vomiting)    after ganglion cyst removal    Past Surgical History Past Surgical History:  Procedure Laterality Date  . COLONOSCOPY N/A 04/30/2015   Procedure: COLONOSCOPY;  Surgeon: Manya Silvas, MD;  Location: [facility] ENDOSCOPY;  Service: Endoscopy;  Laterality: N/A;  . ESOPHAGOGASTRODUODENOSCOPY N/A 04/30/2015   Procedure: ESOPHAGOGASTRODUODENOSCOPY (EGD);  Surgeon: Manya Silvas, MD;  Location: [facility] ENDOSCOPY;  Service: Endoscopy;  Laterality: N/A;  . FRACTURE SURGERY    . GANGLION CYST EXCISION  12/08/2010   finger  . IMAGE GUIDED SINUS SURGERY Bilateral 03/20/2018   Procedure: IMAGE GUIDED SINUS SURGERY;  Surgeon: Carloyn Manner, MD;  Location: [place];  Service: ENT;  Laterality: Bilateral;  NEED STRYKER DISK gave disk  to brenda 3-14 kp  . MAXILLARY ANTROSTOMY Left 03/20/2018   Procedure: MAXILLARY ANTROSTOMY  WITHOUT TISSUE;  Surgeon: Carloyn Manner, MD;  Location: [place];  Service: ENT;  Laterality: Left;  . NASAL SEPTOPLASTY W/ TURBINOPLASTY Bilateral 03/20/2018   Procedure: NASAL SEPTOPLASTY WITH TURBINATE REDUCTION;  Surgeon: Carloyn Manner, MD;  Location: [place];  Service: ENT;  Laterality: Bilateral;    Gynecologic History G0P0000  Patient's last menstrual period was 10/18/2019 (exact date). Contraception: none Last Pap: 2017. Results were: normal   Obstetric History OB History  Gravida Para Term Preterm AB Living  0 0 0 0 0 0  SAB TAB Ectopic Multiple Live Births  0 0 0 0 0    Current Medications Current Outpatient Medications on File Prior to Visit  Medication Sig Dispense Refill  . albuterol (PROVENTIL HFA;VENTOLIN HFA) 108 (90 Base) MCG/ACT inhaler Inhale into the lungs.    . ALPRAZolam (XANAX) 0.5 MG tablet Take 1 tablet (0.5 mg total) by mouth 3 (three) times daily as needed for anxiety. 45 tablet 0  . amphetamine-dextroamphetamine (ADDERALL) 10 MG tablet Take 10 mg by mouth 2 (two) times daily with a meal.    . budesonide-formoterol (SYMBICORT) 80-4.5 MCG/ACT inhaler Inhale 2 puffs into the lungs 2 (two) times daily as needed.    . busPIRone (BUSPAR) 10 MG tablet Take 10 mg by mouth 3 (three) times daily.    . DULoxetine (CYMBALTA) 60 MG capsule Take 60 mg by mouth daily.    Marland Kitchen levonorgestrel-ethinyl estradiol (KURVELO) 0.15-30 MG-MCG tablet Take 1 tablet by mouth daily. 3 Package 6  . pantoprazole (PROTONIX) 40 MG tablet TAKE 1 TABLET(40 MG) BY MOUTH DAILY 30 tablet 3  .  QUEtiapine (SEROQUEL) 50 MG tablet TAKE 1 TABLET(50 MG) BY MOUTH EVERY NIGHT     No current facility-administered medications on file prior to visit.     Review of Systems Patient denies any headaches, blurred vision, shortness of breath, chest pain, abdominal pain, problems with bowel movements, urination, or intercourse.  Objective:  BP 114/61   Pulse (!) 102   Ht 5'  6" (1.676 m)   Wt 241 lb 3 oz (109.4 kg)   LMP 10/18/2019 (Exact Date)   BMI 38.93 kg/m  Physical Exam  General:  Well developed, well nourished, no acute distress. She is alert and oriented x3. Skin:  Warm and dry Neck:  Midline trachea, no thyromegaly or nodules Cardiovascular: Regular rate and rhythm, no murmur heard Lungs:  Effort normal, all lung fields clear to auscultation bilaterally Breasts:  No dominant palpable mass, retraction, or nipple discharge Abdomen:  Soft, non tender, no hepatosplenomegaly or masses Pelvic:  External genitalia is normal in appearance.  The vagina is normal in appearance. The cervix is bulbous, no CMT.  Thin prep pap is done with HR HPV cotesting. Uterus is felt to be normal size, shape, and contour.  No adnexal masses or tenderness noted. Extremities:  No swelling or varicosities noted Psych:  She has a normal mood and affect  Assessment:   Healthy well-woman exam BMI 38  Plan:  Discussed different options for weight loss including atkins/intermittent fasting/ personal trainer. Recommend getting BMI <30 before trying for pregnancy.  F/U 1 year for AE, or sooner if needed   Jaylynn Mcaleer Suzan Nailer, CNM

## 2019-10-24 LAB — CBC
Hematocrit: 38.6 % (ref 34.0–46.6)
Hemoglobin: 12.1 g/dL (ref 11.1–15.9)
MCH: 24.4 pg — ABNORMAL LOW (ref 26.6–33.0)
MCHC: 31.3 g/dL — ABNORMAL LOW (ref 31.5–35.7)
MCV: 78 fL — ABNORMAL LOW (ref 79–97)
Platelets: 342 10*3/uL (ref 150–450)
RBC: 4.96 x10E6/uL (ref 3.77–5.28)
RDW: 16.7 % — ABNORMAL HIGH (ref 11.7–15.4)
WBC: 9 10*3/uL (ref 3.4–10.8)

## 2019-10-24 LAB — COMPREHENSIVE METABOLIC PANEL WITH GFR
ALT: 29 [IU]/L (ref 0–32)
AST: 26 [IU]/L (ref 0–40)
Albumin/Globulin Ratio: 1.4 (ref 1.2–2.2)
Albumin: 4.4 g/dL (ref 3.8–4.8)
Alkaline Phosphatase: 62 [IU]/L (ref 39–117)
BUN/Creatinine Ratio: 12 (ref 9–23)
BUN: 10 mg/dL (ref 6–20)
Bilirubin Total: <0.2 mg/dL (ref 0.0–1.2)
CO2: 19 mmol/L — ABNORMAL LOW (ref 20–29)
Calcium: 9.6 mg/dL (ref 8.7–10.2)
Chloride: 105 mmol/L (ref 96–106)
Creatinine, Ser: 0.81 mg/dL (ref 0.57–1.00)
GFR calc Af Amer: 108 mL/min/{1.73_m2}
GFR calc non Af Amer: 94 mL/min/{1.73_m2}
Globulin, Total: 3.1 g/dL (ref 1.5–4.5)
Glucose: 84 mg/dL (ref 65–99)
Potassium: 4 mmol/L (ref 3.5–5.2)
Sodium: 139 mmol/L (ref 134–144)
Total Protein: 7.5 g/dL (ref 6.0–8.5)

## 2019-10-24 LAB — HEMOGLOBIN A1C
Est. average glucose Bld gHb Est-mCnc: 117 mg/dL
Hgb A1c MFr Bld: 5.7 % — ABNORMAL HIGH (ref 4.8–5.6)

## 2019-10-24 LAB — LIPID PANEL
Chol/HDL Ratio: 3.9 ratio (ref 0.0–4.4)
Cholesterol, Total: 187 mg/dL (ref 100–199)
HDL: 48 mg/dL (ref >39)
LDL Chol Calc (NIH): 109 mg/dL — ABNORMAL HIGH (ref 0–99)
Triglycerides: 171 mg/dL — ABNORMAL HIGH (ref 0–149)
VLDL Cholesterol Cal: 30 mg/dL (ref 5–40)

## 2019-10-24 LAB — FERRITIN: Ferritin: 10 ng/mL — ABNORMAL LOW (ref 15–150)

## 2019-10-24 LAB — THYROID PANEL WITH TSH
Free Thyroxine Index: 1.4 (ref 1.2–4.9)
T3 Uptake Ratio: 18 % — ABNORMAL LOW (ref 24–39)
T4, Total: 8 ug/dL (ref 4.5–12.0)
TSH: 1.51 u[IU]/mL (ref 0.450–4.500)

## 2019-11-05 LAB — PAP IG AND HPV HIGH-RISK: HPV, high-risk: NEGATIVE

## 2020-02-23 ENCOUNTER — Telehealth: Payer: Self-pay

## 2020-02-23 NOTE — Telephone Encounter (Signed)
error 

## 2021-09-15 ENCOUNTER — Encounter: Payer: Self-pay | Admitting: Obstetrics and Gynecology

## 2022-04-25 ENCOUNTER — Other Ambulatory Visit: Payer: Self-pay | Admitting: Internal Medicine

## 2022-04-25 DIAGNOSIS — N6002 Solitary cyst of left breast: Secondary | ICD-10-CM

## 2022-05-01 ENCOUNTER — Other Ambulatory Visit: Payer: Self-pay | Admitting: *Deleted

## 2022-05-01 ENCOUNTER — Inpatient Hospital Stay
Admission: RE | Admit: 2022-05-01 | Discharge: 2022-05-01 | Disposition: A | Discharge status: Home / Self Care | Payer: Self-pay | Source: Ambulatory Visit | Attending: *Deleted | Admitting: *Deleted

## 2022-05-01 DIAGNOSIS — Z1231 Encounter for screening mammogram for malignant neoplasm of breast: Secondary | ICD-10-CM

## 2022-05-18 ENCOUNTER — Ambulatory Visit
Admission: RE | Admit: 2022-05-18 | Discharge: 2022-05-18 | Disposition: A | Discharge status: Home / Self Care | Payer: BC Managed Care – PPO | Source: Ambulatory Visit | Attending: Internal Medicine | Admitting: Internal Medicine

## 2022-05-18 ENCOUNTER — Other Ambulatory Visit: Payer: Self-pay | Admitting: Obstetrics and Gynecology

## 2022-05-18 DIAGNOSIS — N6002 Solitary cyst of left breast: Secondary | ICD-10-CM

## 2022-10-14 ENCOUNTER — Encounter: Payer: Self-pay | Admitting: Emergency Medicine

## 2022-10-14 ENCOUNTER — Emergency Department: Payer: BC Managed Care – PPO

## 2022-10-14 ENCOUNTER — Emergency Department
Admission: EM | Admit: 2022-10-14 | Discharge: 2022-10-14 | Disposition: A | Discharge status: Home / Self Care | Payer: BC Managed Care – PPO | Attending: Emergency Medicine | Admitting: Emergency Medicine

## 2022-10-14 ENCOUNTER — Other Ambulatory Visit: Payer: Self-pay

## 2022-10-14 DIAGNOSIS — R079 Chest pain, unspecified: Secondary | ICD-10-CM | POA: Insufficient documentation

## 2022-10-14 LAB — CBC
HCT: 40.6 % (ref 36.0–46.0)
Hemoglobin: 13.4 g/dL (ref 12.0–15.0)
MCH: 27.6 pg (ref 26.0–34.0)
MCHC: 33 g/dL (ref 30.0–36.0)
MCV: 83.5 fL (ref 80.0–100.0)
Platelets: 291 10*3/uL (ref 150–400)
RBC: 4.86 MIL/uL (ref 3.87–5.11)
RDW: 14.5 % (ref 11.5–15.5)
WBC: 11 10*3/uL — ABNORMAL HIGH (ref 4.0–10.5)
nRBC: 0 % (ref 0.0–0.2)

## 2022-10-14 LAB — LIPASE, BLOOD: Lipase: 38 U/L (ref 11–51)

## 2022-10-14 LAB — POC URINE PREG, ED: Preg Test, Ur: NEGATIVE

## 2022-10-14 LAB — TROPONIN I (HIGH SENSITIVITY)
Troponin I (High Sensitivity): <2 ng/L (ref <18)
Troponin I (High Sensitivity): <2 ng/L (ref <18)

## 2022-10-14 LAB — HEPATIC FUNCTION PANEL
ALT: 15 U/L (ref 0–44)
AST: 18 U/L (ref 15–41)
Albumin: 3.8 g/dL (ref 3.5–5.0)
Alkaline Phosphatase: 64 U/L (ref 38–126)
Bilirubin, Direct: <0.1 mg/dL (ref 0.0–0.2)
Total Bilirubin: 0.6 mg/dL (ref 0.3–1.2)
Total Protein: 7.8 g/dL (ref 6.5–8.1)

## 2022-10-14 LAB — BASIC METABOLIC PANEL
Anion gap: 6 (ref 5–15)
BUN: 11 mg/dL (ref 6–20)
CO2: 21 mmol/L — ABNORMAL LOW (ref 22–32)
Calcium: 9.1 mg/dL (ref 8.9–10.3)
Chloride: 111 mmol/L (ref 98–111)
Creatinine, Ser: 0.85 mg/dL (ref 0.44–1.00)
GFR, Estimated: >60 mL/min (ref >60)
Glucose, Bld: 122 mg/dL — ABNORMAL HIGH (ref 70–99)
Potassium: 3.8 mmol/L (ref 3.5–5.1)
Sodium: 138 mmol/L (ref 135–145)

## 2022-10-14 MED ORDER — SODIUM CHLORIDE 0.9 % IV BOLUS
1000.0000 mL | Freq: Once | INTRAVENOUS | Status: AC
  Administered 2022-10-14: 1000 mL via INTRAVENOUS

## 2022-10-14 MED ORDER — IOHEXOL 350 MG/ML SOLN
75.0000 mL | Freq: Once | INTRAVENOUS | Status: AC | PRN
  Administered 2022-10-14: 75 mL via INTRAVENOUS

## 2022-10-14 NOTE — ED Provider Notes (Signed)
[facility] Provider Note    Event Date/Time   First MD Initiated Contact with Patient 10/14/22 1656     (approximate)   History   Chest Pain   HPI  Heather Ferguson is a 41 y.o. female presents to the emergency department with chest pain that started today.  Patient states pain radiated into the left arm and she felt weak, turned pale, and got a little sweaty.  Patient has had symptoms for the past few weeks in which she did a 48-hour Holter monitor recently.  Does not have the results from this yet.  Patient states her heart rate is dropping into the 40s and she will feel like she cannot breathe though increased to a normal level.  Recently increased her Prozac level about a month ago.  Is on Vyvanse for ADHD.  Patient denies any hormone replacement therapy.      Physical Exam   Triage Vital Signs: ED Triage Vitals  Enc Vitals Group     BP 10/14/22 1619 (!) 143/73     Pulse Rate 10/14/22 1619 (!) 46     Resp 10/14/22 1619 16     Temp 10/14/22 1619 98.6 F (37 C)     Temp Source 10/14/22 1619 Oral     SpO2 10/14/22 1619 97 %     Weight 10/14/22 1620 231 lb (104.8 kg)     Height 10/14/22 1620 5\' 6"  (1.676 m)     Head Circumference --      Peak Flow --      Pain Score 10/14/22 1619 8     Pain Loc --      Pain Edu? --      Excl. in [place]? --     Most recent vital signs: Vitals:   10/14/22 1619  BP: (!) 143/73  Pulse: (!) 46  Resp: 16  Temp: 98.6 F (37 C)  SpO2: 97%     General: Awake, no distress.   CV:  Good peripheral perfusion. regular rate and  rhythm Resp:  Normal effort. Lungs CTA Abd:  No distention.   Other:      ED Results / Procedures / Treatments   Labs (all labs ordered are listed, but only abnormal results are displayed) Labs Reviewed  BASIC METABOLIC PANEL - Abnormal; Notable for the following components:      Result Value   CO2 21 (*)    Glucose, Bld 122 (*)    All other components within normal limits  CBC -  Abnormal; Notable for the following components:   WBC 11.0 (*)    All other components within normal limits  HEPATIC FUNCTION PANEL  LIPASE, BLOOD  POC URINE PREG, ED  TROPONIN I (HIGH SENSITIVITY)  TROPONIN I (HIGH SENSITIVITY)     EKG  EKG   RADIOLOGY Chest x-ray    PROCEDURES:   Procedures   MEDICATIONS ORDERED IN ED: Medications  sodium chloride 0.9 % bolus 1,000 mL (1,000 mLs Intravenous New Bag/Given 10/14/22 1738)  iohexol (OMNIPAQUE) 350 MG/ML injection 75 mL (75 mLs Intravenous Contrast Given 10/14/22 1940)     IMPRESSION / MDM / ASSESSMENT AND PLAN / ED COURSE  I reviewed the triage vital signs and the nursing notes.                              Differential diagnosis includes, but is not limited to, chest pain, arrhythmia, MI, PE  Patient's presentation is most consistent with acute presentation with potential threat to life or bodily function.   Patient's initial labs are reassuring, her CBC metabolic panel troponin are all normal, will await second troponin and add hepatic panel and lipase  Ordered for saline lock, normal saline 1 L IV, will do CTA for PE   Hepatic panel lipase are normal so do not feel her chest pain is caused by pancreatitis or cholelithiasis  CTA and second troponin pending, care transferred to Dr. Jacqualine Code  FINAL CLINICAL IMPRESSION(S) / ED DIAGNOSES   Final diagnoses:  Chest pain, unspecified type     Rx / DC Orders   ED Discharge Orders     None        Note:  This document was prepared using Dragon voice recognition software and may include unintentional dictation errors.    Versie Starks, PA-C 10/14/22 Claybon Jabs, MD 10/14/22 2226

## 2022-10-14 NOTE — ED Notes (Signed)
First Nurse Note: Pt to ED via POV for chest pain for the past few weeks.

## 2022-10-14 NOTE — ED Provider Notes (Signed)
Medical screening examination/treatment/procedure(s) were conducted as a shared visit with non-physician practitioner(s) and myself.  I personally evaluated the patient during the encounter.    Delman Kitten, MD 10/14/22 2208

## 2022-10-14 NOTE — Discharge Instructions (Addendum)

## 2022-10-14 NOTE — ED Triage Notes (Signed)
Pt presents with CP reports she feels her heart beating hard and noticed this feeling is when her heart rate drops to 40 bpm. Pt reports she just turned in her heart monitor this week. Pt was shopping today and felt like she was going to pass out. Pt feels nausea, aching feeling in her chest, dull pain to rib cage and left arm. Pt talks in complete sentences.

## 2022-11-01 ENCOUNTER — Other Ambulatory Visit: Payer: Self-pay | Admitting: Internal Medicine

## 2022-11-01 DIAGNOSIS — R079 Chest pain, unspecified: Secondary | ICD-10-CM

## 2022-11-03 ENCOUNTER — Emergency Department
Admission: EM | Admit: 2022-11-03 | Discharge: 2022-11-03 | Disposition: A | Discharge status: Home / Self Care | Payer: BC Managed Care – PPO | Attending: Emergency Medicine | Admitting: Emergency Medicine

## 2022-11-03 ENCOUNTER — Other Ambulatory Visit: Payer: Self-pay

## 2022-11-03 ENCOUNTER — Emergency Department: Payer: BC Managed Care – PPO

## 2022-11-03 DIAGNOSIS — R079 Chest pain, unspecified: Secondary | ICD-10-CM | POA: Insufficient documentation

## 2022-11-03 DIAGNOSIS — R5383 Other fatigue: Secondary | ICD-10-CM | POA: Insufficient documentation

## 2022-11-03 LAB — TROPONIN I (HIGH SENSITIVITY)
Troponin I (High Sensitivity): <2 ng/L (ref <18)
Troponin I (High Sensitivity): <2 ng/L (ref <18)

## 2022-11-03 LAB — CBC
HCT: 40.5 % (ref 36.0–46.0)
Hemoglobin: 13.3 g/dL (ref 12.0–15.0)
MCH: 27.4 pg (ref 26.0–34.0)
MCHC: 32.8 g/dL (ref 30.0–36.0)
MCV: 83.5 fL (ref 80.0–100.0)
Platelets: 259 10*3/uL (ref 150–400)
RBC: 4.85 MIL/uL (ref 3.87–5.11)
RDW: 14.2 % (ref 11.5–15.5)
WBC: 9.9 10*3/uL (ref 4.0–10.5)
nRBC: 0 % (ref 0.0–0.2)

## 2022-11-03 LAB — BASIC METABOLIC PANEL
Anion gap: 8 (ref 5–15)
BUN: 13 mg/dL (ref 6–20)
CO2: 24 mmol/L (ref 22–32)
Calcium: 9.5 mg/dL (ref 8.9–10.3)
Chloride: 106 mmol/L (ref 98–111)
Creatinine, Ser: 0.85 mg/dL (ref 0.44–1.00)
GFR, Estimated: >60 mL/min (ref >60)
Glucose, Bld: 107 mg/dL — ABNORMAL HIGH (ref 70–99)
Potassium: 3.6 mmol/L (ref 3.5–5.1)
Sodium: 138 mmol/L (ref 135–145)

## 2022-11-03 NOTE — Discharge Instructions (Signed)
Cut back on caffeine for the next week. Increase hydration at home.

## 2022-11-03 NOTE — ED Provider Notes (Signed)
[facility] Provider Note  Patient Contact: 5:49 PM (approximate)   History   Chest Pain   HPI  Heather Ferguson is a 41 y.o. female with a history of GERD and anxiety, PCOS and ADHD, presents to the emergency department with concern for bradycardia.  Patient states that she felt a thump in her chest and noticed that her heart rate dropped into the high 30s.  Patient stated that she felt fatigued and had a foggy sensation.  Patient reports that her heart rate has seemed to normalize since initial presenting symptoms although she still feels fatigued.  Patient is experiencing symptoms in the past for at least the past year and has been well followed by cardiology.  Patient has had a stress test and has worn a Holter monitor with reassuring results and cardiology is referring patient to a pulmonologist with concern for possible sleep apnea and anticipates patient having a CT angio of the heart in December.  Patient did report that she restarted her Vyvanse today after being off this medication for several months.  She states that she does have caffeine intake which is normally 3-4 sodas a week and a daily coffee.  She denies current chest pain, chest tightness or shortness of breath.      Physical Exam   Triage Vital Signs: ED Triage Vitals  Enc Vitals Group     BP 11/03/22 1545 (!) 151/78     Pulse Rate 11/03/22 1545 89     Resp 11/03/22 1545 18     Temp 11/03/22 1545 98 F (36.7 C)     Temp src --      SpO2 11/03/22 1545 95 %     Weight --      Height --      Head Circumference --      Peak Flow --      Pain Score 11/03/22 1544 8     Pain Loc --      Pain Edu? --      Excl. in GC? --     Most recent vital signs: Vitals:   11/03/22 1845 11/03/22 1900  BP: 108/70 111/64  Pulse: 66 71  Resp: 16 17  Temp:    SpO2: 97% 97%     General: Alert and in no acute distress. Eyes:  PERRL. EOMI. Head: No acute traumatic findings ENT:      Nose: No  congestion/rhinnorhea.      Mouth/Throat: Mucous membranes are moist.  Neck: No stridor. No cervical spine tenderness to palpation. Cardiovascular:  Good peripheral perfusion Respiratory: Normal respiratory effort without tachypnea or retractions. Lungs CTAB. Good air entry to the bases with no decreased or absent breath sounds. Gastrointestinal: Bowel sounds 4 quadrants. Soft and nontender to palpation. No guarding or rigidity. No palpable masses. No distention. No CVA tenderness. Musculoskeletal: Full range of motion to all extremities.  Neurologic:  No gross focal neurologic deficits are appreciated.  Skin:   No rash noted Other:   ED Results / Procedures / Treatments   Labs (all labs ordered are listed, but only abnormal results are displayed) Labs Reviewed  BASIC METABOLIC PANEL - Abnormal; Notable for the following components:      Result Value   Glucose, Bld 107 (*)    All other components within normal limits  CBC  TROPONIN I (HIGH SENSITIVITY)  TROPONIN I (HIGH SENSITIVITY)     EKG  Normal sinus rhythm with frequent PVCs.   RADIOLOGY  I personally viewed and evaluated these images as part of my medical decision making, as well as reviewing the written report by the radiologist.  ED Provider Interpretation: No consolidations, opacities or infiltrates.  No pneumothorax or pneumomediastinum.   PROCEDURES:  Critical Care performed: No  Procedures   MEDICATIONS ORDERED IN ED: Medications - No data to display   IMPRESSION / MDM / ASSESSMENT AND PLAN / ED COURSE  I reviewed the triage vital signs and the nursing notes.                              Assessment and plan Chest pain 41 year old female presents to the emergency department with concern for palpitations and intermittent bradycardia.  Patient was hypertensive at triage but vital signs were otherwise reassuring.  On exam, patient was well-appearing was able to provide her own historical  information.  She was not diaphoretic and bedside monitor showed a heart rate in the 70s the entire time history and physical exam were taking place.  CBC and BMP were within range.  Chest x-ray unremarkable.  EKG indicates numerous PVCs but no other abnormality.  Will obtain a second troponin and have patient continue to follow-up with cardiology.   Delta troponin within range.  Orthostatics reassuring.  We will have patient follow-up with cardiology.  Return precautions were given to return with new or worsening symptoms.  Recommended reduction of caffeine and increase hydration this week.   FINAL CLINICAL IMPRESSION(S) / ED DIAGNOSES   Final diagnoses:  Chest pain, unspecified type     Rx / DC Orders   ED Discharge Orders     None        Note:  This document was prepared using Dragon voice recognition software and may include unintentional dictation errors.   Pia Mau [place], PA-C 11/03/22 1926    Pilar Jarvis, MD 11/04/22 2123

## 2022-11-03 NOTE — ED Triage Notes (Signed)
Pt comes with c/o CP and stating her HR dropped to the 30s. Pt states this happened recently and she was sent to cardiologist. Pt states no meds prescribed and was suppose to follow up in 2 months.   Pt states today she started to have chest pain, became foggy and her side was hurting.   Pt states some tightness in mid sternal cp at this time.

## 2022-11-17 ENCOUNTER — Telehealth (HOSPITAL_COMMUNITY): Payer: Self-pay | Admitting: *Deleted

## 2022-11-17 NOTE — Telephone Encounter (Signed)
Attempted to call patient regarding upcoming cardiac CT appointment. °Left message on voicemail with name and callback number ° °Borna Wessinger RN Navigator Cardiac Imaging °[facility] °[phone] Office °[phone] Cell ° °

## 2022-11-20 ENCOUNTER — Ambulatory Visit: Admission: RE | Admit: 2022-11-20 | Payer: BC Managed Care – PPO | Source: Ambulatory Visit

## 2023-02-20 IMAGING — MG DIGITAL DIAGNOSTIC BILAT W/ TOMO W/ CAD
8 series · 8 of 24 positions shown · non-contrast
Comparison: Prior outside MRI dated April 22, 2022.

CLINICAL DATA: Prior liver MRI at outside institution demonstrates
bilateral T2 bright masses at the base of bilateral breasts.
Enhancement kinetics could not be evaluated due to lack of fat
saturation.

EXAM:
DIGITAL DIAGNOSTIC BILATERAL MAMMOGRAM WITH TOMOSYNTHESIS AND CAD;
ULTRASOUND LEFT BREAST LIMITED
TECHNIQUE: Bilateral digital diagnostic mammography and breast tomosynthesis
was performed. The images were evaluated with computer-aided
detection.; Targeted ultrasound examination of the left breast was
performed.

[L MLO synth-2D]
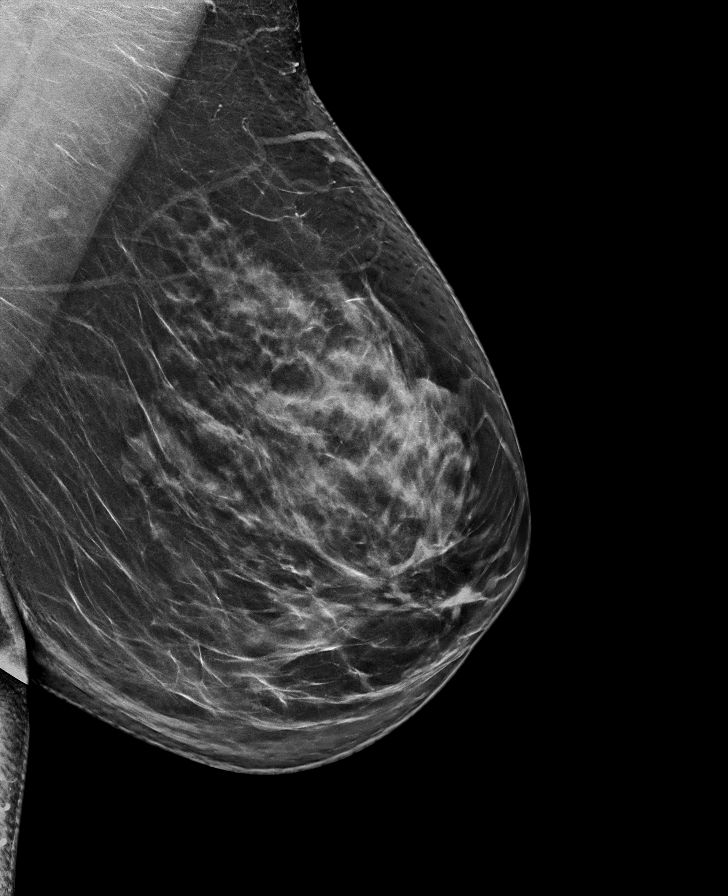

[L CC synth-2D]
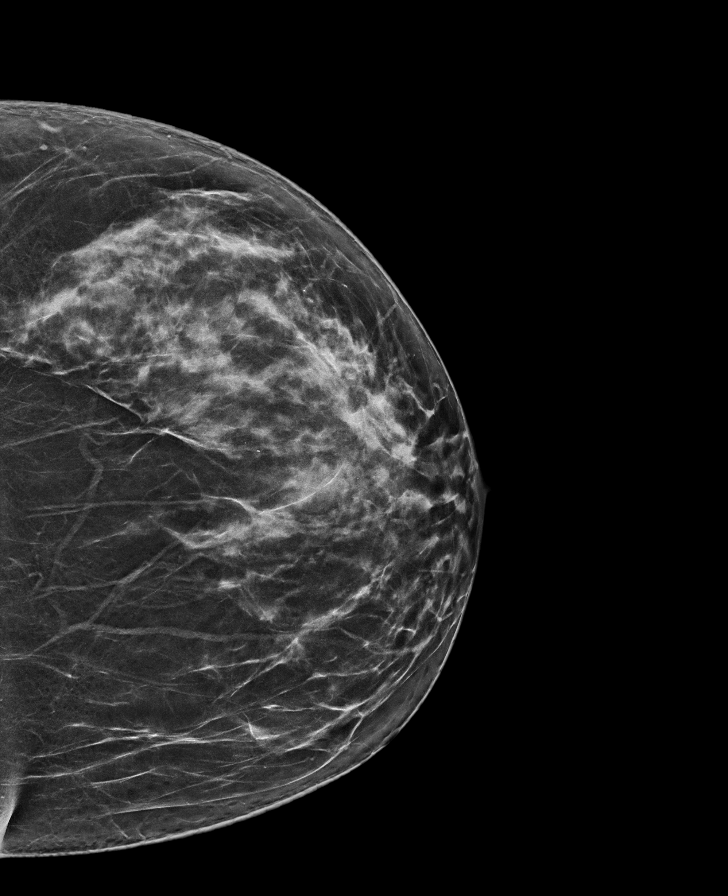

[R MLO synth-2D]
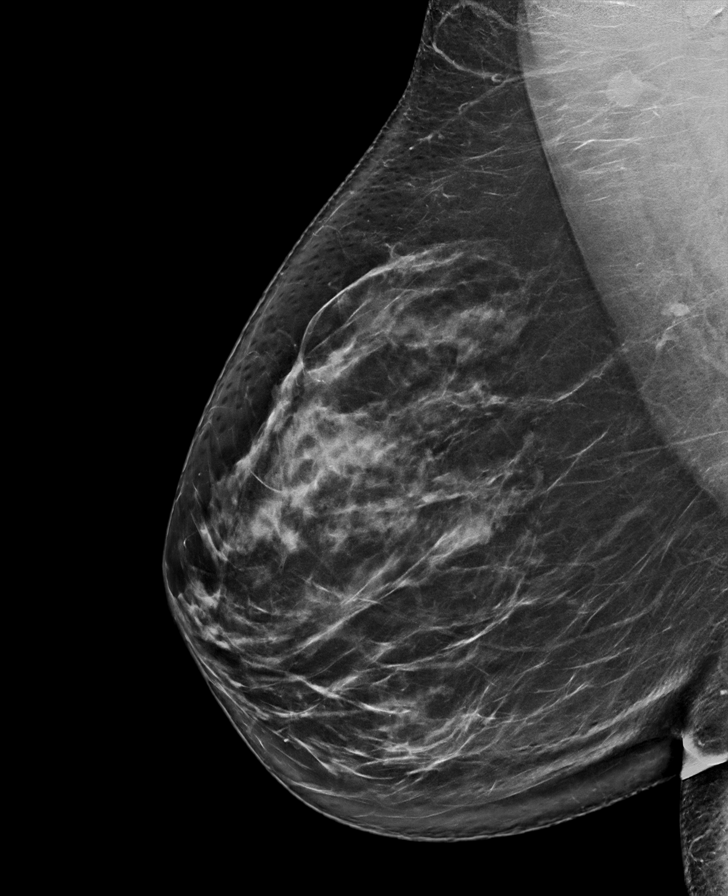

[R CC synth-2D]
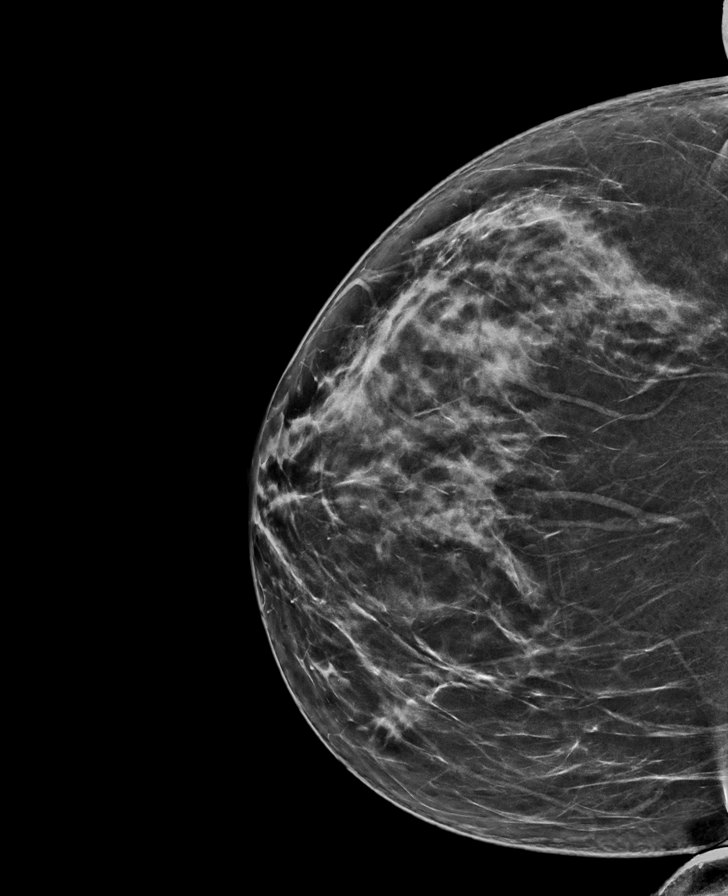

[R MLO tomo · tomo slice 45/89.0]
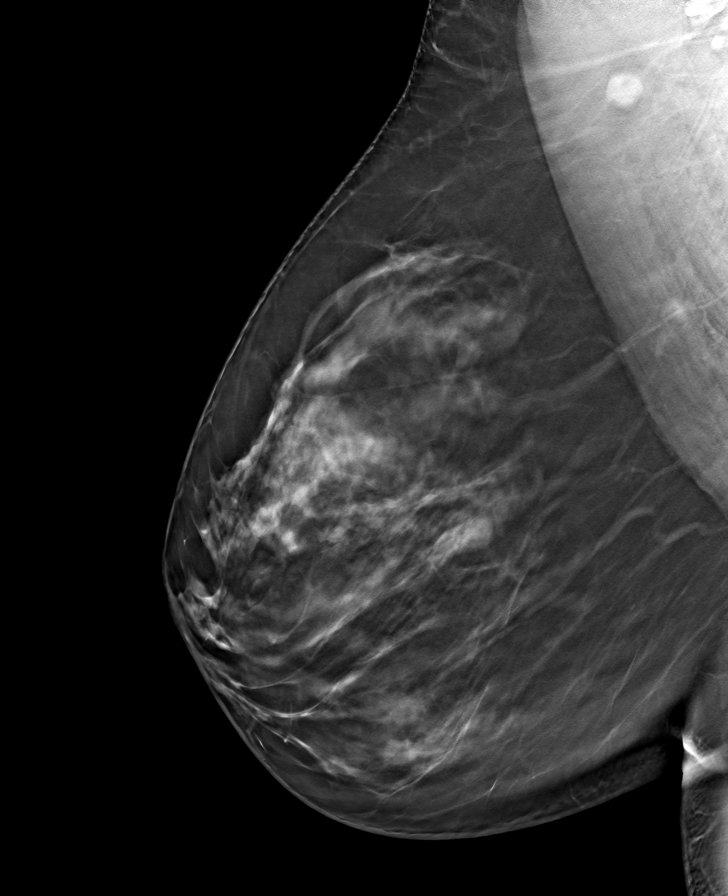

[L CC tomo · tomo slice 38/75.0]
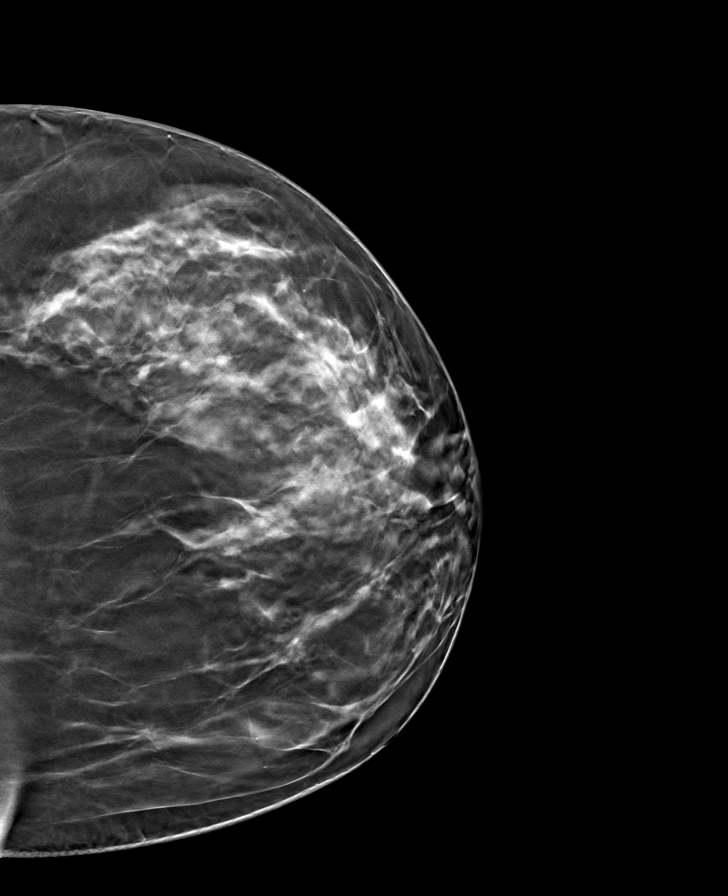

[R CC tomo · tomo slice 41/80.0]
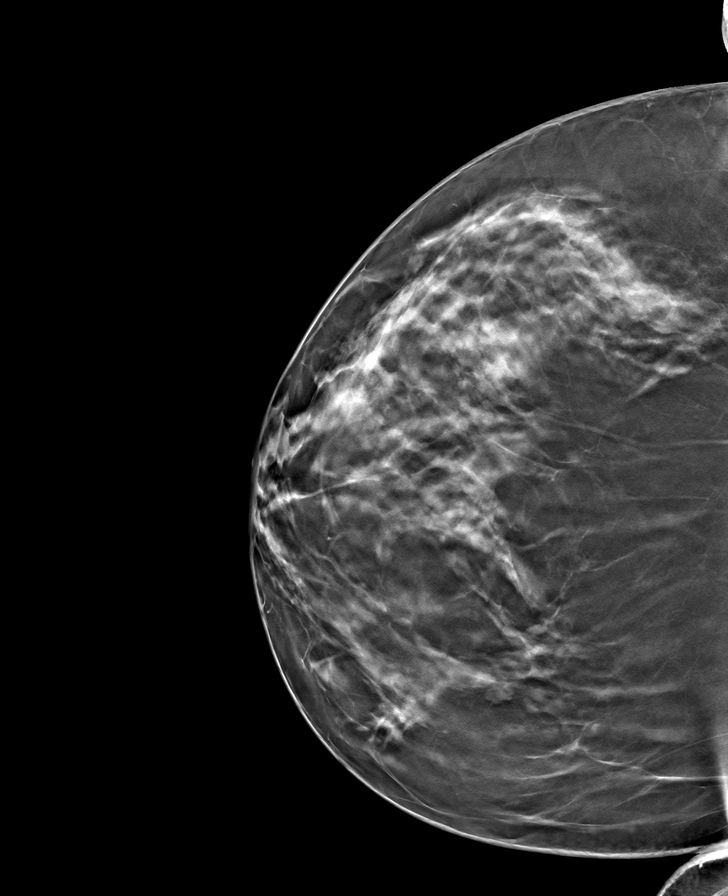

[L MLO tomo · tomo slice 44/87.0]
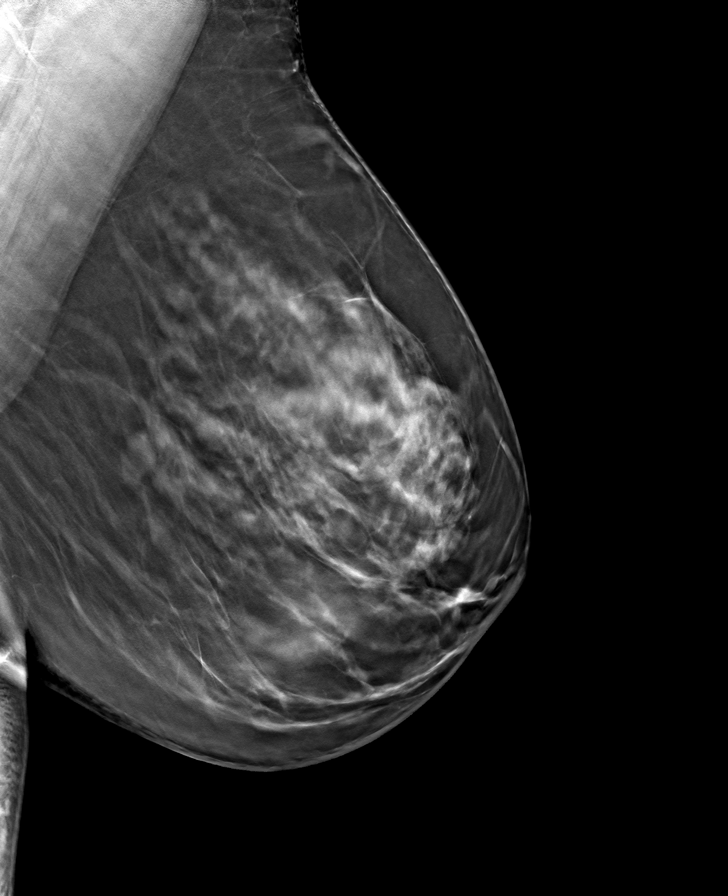

[8 of 24 positions shown; findings below may reference images not displayed]

ACR Breast Density Category c: The breast tissue is heterogeneously
dense, which may obscure small masses.
FINDINGS: No suspicious mass, distortion, or microcalcifications are
identified to suggest presence of malignancy. There are multiple
bilateral round and oval circumscribed masses, most consistent with
benign fibroadenomas or cysts.

On physical exam, no suspicious mass is appreciated.

Targeted ultrasound was performed of the LEFT outer and lower outer
breast.

At 5 o'clock 7 cm from the nipple, there is an oval circumscribed
anechoic mass with posterior acoustic enhancement. It measures 7 x 3
by 5 mm and is consistent with a benign simple cyst. At 4 o'clock 7
cm from the nipple, there is an oval circumscribed anechoic mass
with posterior acoustic enhancement and thin internal septations. It
measures 4 x 6 x 5 mm and is consistent with a benign cluster of
cysts. At 3 o'clock 3 cm from the nipple, there is an oval
circumscribed anechoic mass with posterior acoustic enhancement. It
measures 8 x 8 x 5 mm and is consistent with a benign simple cyst.
At [REDACTED] cm from the nipple, there is an oval circumscribed
anechoic mass with posterior acoustic enhancement. It measures 9 x
10 by 9 mm and is consistent with a benign simple cyst.
IMPRESSION: No mammographic evidence of malignancy bilaterally. Multiple benign
cysts are noted.

RECOMMENDATION:
Screening mammogram in one year.(Code:[id number])

I have discussed the findings and recommendations with the patient.
If applicable, a reminder letter will be sent to the patient
regarding the next appointment.

BI-RADS CATEGORY  2: Benign.

## 2024-09-10 ENCOUNTER — Other Ambulatory Visit: Payer: Self-pay | Admitting: Physician Assistant

## 2024-09-10 DIAGNOSIS — R1011 Right upper quadrant pain: Secondary | ICD-10-CM

## 2024-09-11 ENCOUNTER — Ambulatory Visit
Admission: RE | Admit: 2024-09-11 | Discharge: 2024-09-11 | Disposition: A | Discharge status: Home / Self Care | Source: Ambulatory Visit | Attending: Physician Assistant | Admitting: Physician Assistant

## 2024-09-11 DIAGNOSIS — R1011 Right upper quadrant pain: Secondary | ICD-10-CM | POA: Insufficient documentation

## 2024-09-18 NOTE — H&P (View-Only) (Signed)
 History of Present Illness Heather Ferguson is a 43 year old female with gallstones who presents with right upper abdominal pain.  She experiences intermittent episodes of severe right upper abdominal pain, sometimes radiating to her back and left side, and occasionally accompanied by pruritus. Initially, she thought the pain was due to a urinary tract infection because of associated back pain, but it was later identified as gallbladder-related.  She recalls a similar episode in 2023, which required emergency room evaluation at [facility], where a stone was suspected to have passed. She also experienced a similar pain episode in 2005.  An abdominal ultrasound performed on September 11, 2034, revealed choleric acid without gallbladder thickening, no pericardial cystic fluid, and a bile duct measuring 3.6 millimeters. A hepatic function panel on September 10, 2034, showed a total bilirubin of 0.3, and a CBC indicated a white blood cell count of 5.8 and hemoglobin of 13.6.  In 2023, during an emergency room visit, an ultrasound showed stones in the common bile duct, but an MRI did not show stones, and her bilirubin levels were normal.  She reports occasional body aches and chills. No burning sensation during urination. Her urine tests have returned normal results.      PAST MEDICAL HISTORY:  Past Medical History:  Diagnosis Date  . ADHD    followed by behavioral medicine  . Allergy    Doxcycline  . Anxiety   . Chronic heartburn 03/23/2015  . Depression, major, recurrent ()    followed by behavioral medicine  . Exercise-induced asthma (HHS-HCC)   . GERD (gastroesophageal reflux disease)   . History of fracture of nose         PAST SURGICAL HISTORY:   Past Surgical History:  Procedure Laterality Date  . NASAL FRACTURE REPAIR  07/20/2008  . EXCISION OF LEFT MIDDLE FINGER GANGLION  12/08/2010   flexor tendon sheath  . Wisdom Teeth Extraction  01/18/2013  . Gum Graft  06/17/2014  .  COLONOSCOPY  04/30/2015   Mild Active Colitis: CBF 02/2033 @ age 32  . EGD  04/30/2015   No repeat per RTE         MEDICATIONS:  Outpatient Encounter Medications as of 09/18/2024  Medication Sig Dispense Refill  . albuterol  90 mcg/actuation inhaler Inhale 2 inhalations into the lungs every 6 (six) hours as needed for Wheezing 18 g 3  . budesonide-formoteroL (SYMBICORT) 80-4.5 mcg/actuation inhaler Inhale 2 inhalations into the lungs 2 (two) times daily 1 Inhaler 11  . cholecalciferol (VITAMIN D3) 2,000 unit capsule Take 2,000 Units by mouth once daily    . cyanocobalamin (VITAMIN B12) 1,000 mcg/mL injection Inject 1 mL (1,000 mcg total) into the muscle monthly 10 mL 3  . FLUoxetine  (PROZAC ) 20 MG capsule Take 60 mg by mouth    . fluticasone propionate (FLONASE) 50 mcg/actuation nasal spray Place 1 spray into both nostrils 2 (two) times daily 16 g 2  . lisdexamfetamine (VYVANSE) 50 MG capsule Take 50 mg by mouth every morning    . pantoprazole  (PROTONIX ) 40 MG DR tablet Take 1 tablet (40 mg total) by mouth 2 (two) times daily before meals (Patient taking differently: Take 40 mg by mouth once daily) 180 tablet 1  . WEGOVY 0.5 mg/0.5 mL pen injector INJECT 0.5 MG SUBCUTANEOUSLY ONCE A WEEK FOR WEIGHT LOSS    . FLUoxetine  (PROZAC ) 40 MG capsule Take 60 mg by mouth once daily (Patient not taking: Reported on 09/18/2024)    . lisdexamfetamine (VYVANSE) 20 MG  capsule Take 50 mg by mouth every morning (Patient not taking: Reported on 09/18/2024)    . tirzepatide (ZEPBOUND) 2.5 mg/0.5 mL pen injector Inject 2.5 mg subcutaneously every 7 (seven) days (Patient not taking: Reported on 09/18/2024)     No facility-administered encounter medications on file as of 09/18/2024.     ALLERGIES:   Doxycycline   SOCIAL HISTORY:  Social History   Socioeconomic History  . Marital status: Married  Tobacco Use  . Smoking status: Never  . Smokeless tobacco: Never  Vaping Use  . Vaping status: Never Used   Substance and Sexual Activity  . Alcohol use: Not Currently    Alcohol/week: 3.0 standard drinks of alcohol    Types: 3 Cans of beer per week    Comment: Every couple of months  . Drug use: Never  . Sexual activity: Yes    Partners: Female    Birth control/protection: None   Social Drivers of Health   Financial Resource Strain: Medium Risk (09/18/2024)   Overall Financial Resource Strain (CARDIA)   . Difficulty of Paying Living Expenses: Somewhat hard  Food Insecurity: Food Insecurity Present (09/18/2024)   Hunger Vital Sign   . Worried About Programme researcher, broadcasting/film/video in the Last Year: Never true   . Ran Out of Food in the Last Year: Sometimes true  Transportation Needs: No Transportation Needs (09/18/2024)   PRAPARE - Transportation   . Lack of Transportation (Medical): No   . Lack of Transportation (Non-Medical): No    FAMILY HISTORY:  Family History  Problem Relation Name Age of Onset  . High blood pressure (Hypertension) Mother Alianna Wurster   . Anxiety Mother Biddie Sebek   . Depression Mother Christi Wirick   . Heart block Father Theodosia Bahena   . Heart disease Father Hector Venne   . High blood pressure (Hypertension) Father Marice Guidone   . Alcohol abuse Father Winry Egnew        Recently has stopped due to diagnosis of kidney cancer  . Anxiety Father Selda Jalbert   . Colon polyps Father Kimra Kantor   . Coronary Artery Disease (Blocked arteries around heart) Father Adelayde Minney   . Depression Father Karyn Brull   . Diverticulosis Brother Francis Holstein   . Hyperlipidemia (Elevated cholesterol) Brother Francis Holstein        Half brother  . High blood pressure (Hypertension) Brother Francis Holstein        Half brother  . High blood pressure (Hypertension) Brother    . Lung cancer Other Great Uncle   . Colon cancer Other Great Uncle   . Lung cancer Other Great Aunt   . Lung cancer Other Great Aunt   . Alzheimer's disease Maternal Grandmother Orlean Lawrence        Deceased  at age 39  . Anxiety Maternal Grandmother [employer]   . Alcohol abuse Paternal Grandfather Issac page        Deseased - Cirrhosis of liver  . Colon polyps Paternal Grandmother Virginia  Page      GENERAL REVIEW OF SYSTEMS:   General ROS: negative for - chills, fatigue, fever, weight gain or weight loss Allergy and Immunology ROS: negative for - hives  Hematological and Lymphatic ROS: negative for - bleeding problems or bruising, negative for palpable nodes Endocrine ROS: negative for - heat or cold intolerance, hair changes Respiratory ROS: negative for - cough, shortness of breath or wheezing Cardiovascular ROS: no chest pain or palpitations GI ROS: negative for  nausea, vomiting, abdominal pain, diarrhea, constipation Musculoskeletal ROS: negative for - joint swelling or muscle pain Neurological ROS: negative for - confusion, syncope Dermatological ROS: negative for pruritus and rash  PHYSICAL EXAM:  Vitals:   09/18/24 1111  BP: 127/71  Pulse: 84  .  Ht:167.6 cm (5' 6) Wt:83 kg (183 lb) ADJ:Anib surface area is 1.97 meters squared. Body mass index is 29.54 kg/m.SABRA   GENERAL: Alert, active, oriented x3  HEENT: Pupils equal reactive to light. Extraocular movements are intact. Sclera clear. Palpebral conjunctiva normal red color.Pharynx clear.  NECK: Supple with no palpable mass and no adenopathy.  LUNGS: Sound clear with no rales rhonchi or wheezes.  HEART: Regular rhythm S1 and S2 without murmur.  ABDOMEN: Soft and depressible, nontender with no palpable mass, no hepatomegaly. Wounds dry and clean.  EXTREMITIES: Well-developed well-nourished symmetrical with no dependent edema.  NEUROLOGICAL: Awake alert oriented, facial expression symmetrical, moving all extremities.   Results LABS Hepatic function panel: Total bilirubin 0.3 (09/10/2024) CBC: WBC 5.8, Hb 13.6 (09/10/2024)  RADIOLOGY Abdominal ultrasound: Cholelithiasis without gallbladder wall thickening, no  pericholecystic fluid, bile duct 3.6 mm (09/11/2024)    Assessment & Plan Cholelithiasis with recurrent biliary colic   She experiences recurrent biliary colic with right upper quadrant and back pain due to gallstones causing intermittent obstruction. Previous episodes occurred in 2023 and 2005, with a recent ultrasound confirming gallstones. There is no current evidence of acute cholecystitis or choledocholithiasis, as bilirubin levels are normal and the ultrasound shows no gallbladder wall thickening or pericholecystic fluid. Given her history and symptoms, future episodes are highly likely. Potential complications of untreated gallstones, such as pancreatitis and cholangitis, were discussed, along with the benefits of surgical intervention. Gallstones can cause severe complications if they obstruct the bile duct, leading to pancreatitis or liver infection. Gallbladder removal is recommended to prevent these risks and resolve symptoms. Recommend laparoscopic cholecystectomy to prevent future episodes and complications. The surgical procedure involves a minimally invasive approach with four small incisions and same-day discharge.  We discussed about risks of surgery including bleeding, infection, injury to, biliary leak, injury to adjacent organ, among others.  She should avoid taking Wegovy one week prior to surgery due to anesthesia considerations. Schedule laparoscopic cholecystectomy within 30 days, with her to confirm the date after checking personal and work schedules. Provide pre-operative instructions, including cessation of Wegovy one week prior to surgery. Arrange for post-operative follow-up two weeks after surgery to assess recovery and address any concerns.   Cholelithiasis without cholecystitis [K80.20]          Patient verbalized understanding, all questions were answered, and were agreeable with the plan outlined above.   Lucas Sjogren, MD  Electronically signed by Lucas Sjogren, MD

## 2024-10-01 ENCOUNTER — Ambulatory Visit: Payer: Self-pay | Admitting: General Surgery

## 2024-10-03 ENCOUNTER — Other Ambulatory Visit: Payer: Self-pay

## 2024-10-03 ENCOUNTER — Encounter
Admission: RE | Admit: 2024-10-03 | Discharge: 2024-10-03 | Disposition: A | Discharge status: Home / Self Care | Source: Ambulatory Visit | Attending: General Surgery | Admitting: General Surgery

## 2024-10-03 VITALS — Ht 66.0 in | Wt 183.0 lb

## 2024-10-03 DIAGNOSIS — Z01818 Encounter for other preprocedural examination: Secondary | ICD-10-CM

## 2024-10-03 HISTORY — DX: Hyperlipidemia, unspecified: E78.5

## 2024-10-03 HISTORY — DX: Calculus of gallbladder without cholecystitis without obstruction: K80.20

## 2024-10-03 HISTORY — DX: Iron deficiency: E61.1

## 2024-10-03 HISTORY — DX: Sleep apnea, unspecified: G47.30

## 2024-10-03 NOTE — Patient Instructions (Addendum)
 Your procedure is scheduled on:10-10-24 Friday Report to the Registration Desk on the 1st floor of the [facility].Then proceed to the 2nd floor Surgery Desk To find out your arrival time, please call [phone] between 1PM - 3PM on:10-09-24 Thursday If your arrival time is 6:00 am, do not arrive before that time as the [facility] entrance doors do not open until 6:00 am.  REMEMBER: Instructions that are not followed completely may result in serious medical risk, up to and including death; or upon the discretion of your surgeon and anesthesiologist your surgery may need to be rescheduled.  Do not eat food OR drink liquids after midnight the night before surgery.  No gum chewing or hard candies.  One week prior to surgery:Stop NOW (10-03-24)  Stop Anti-inflammatories (NSAIDS) such as Advil, Aleve, Ibuprofen, Motrin, Naproxen, Naprosyn and Aspirin based products such as Excedrin, Goody's Powder, BC Powder. Stop ANY OVER THE COUNTER supplements until after surgery (Vitamin D3)  You may however, continue to take Tylenol  if needed for pain up until the day of surgery.  Stop semaglutide-weight management (WEGOVY) 7 days prior to surgery-Do NOT take again until AFTER surgery  Continue taking all of your other prescription medications up until the day of surgery.  ON THE DAY OF SURGERY ONLY TAKE THESE MEDICATIONS WITH SIPS OF WATER: -FLUoxetine  (PROZAC )  -pantoprazole  (PROTONIX )   Bring your Albuterol  Inhaler to the hospital  No Alcohol for 24 hours before or after surgery.  No Smoking including e-cigarettes for 24 hours before surgery.  No chewable tobacco products for at least 6 hours before surgery.  No nicotine patches on the day of surgery.  Do not use any recreational drugs for at least a week (preferably 2 weeks) before your surgery.  Please be advised that the combination of cocaine and anesthesia may have negative outcomes, up to and including death. If you test positive  for cocaine, your surgery will be cancelled.  On the morning of surgery brush your teeth with toothpaste and water, you may rinse your mouth with mouthwash if you wish. Do not swallow any toothpaste or mouthwash.  Use CHG Soap as directed on instruction sheet.  Do not wear jewelry, make-up, hairpins, clips or nail polish.  For welded (permanent) jewelry: bracelets, anklets, waist bands, etc.  Please have this removed prior to surgery.  If it is not removed, there is a chance that hospital personnel will need to cut it off on the day of surgery.  Do not wear lotions, powders, or perfumes.   Do not shave body hair from the neck down 48 hours before surgery.  Contact lenses, hearing aids and dentures may not be worn into surgery.  Do not bring valuables to the hospital. [facility] is not responsible for any missing/lost belongings or valuables.   Notify your doctor if there is any change in your medical condition (cold, fever, infection).  Wear comfortable clothing (specific to your surgery type) to the hospital.  After surgery, you can help prevent lung complications by doing breathing exercises.  Take deep breaths and cough every 1-2 hours. Your doctor may order a device called an Incentive Spirometer to help you take deep breaths. When coughing or sneezing, hold a pillow firmly against your incision with both hands. This is called "splinting." Doing this helps protect your incision. It also decreases belly discomfort.  If you are being admitted to the hospital overnight, leave your suitcase in the car. After surgery it may be brought to your  room.  In case of increased patient census, it may be necessary for you, the patient, to continue your postoperative care in the Same Day Surgery department.  If you are being discharged the day of surgery, you will not be allowed to drive home. You will need a responsible individual to drive you home and stay with you for 24 hours after surgery.    If you are taking public transportation, you will need to have a responsible individual with you.  Please call the Pre-admissions Testing Dept. at [phone] if you have any questions about these instructions.  Surgery Visitation Policy:  Patients having surgery or a procedure may have two visitors.  Children under the age of 70 must have an adult with them who is not the patient.                                                                                                             Preparing for Surgery with CHLORHEXIDINE GLUCONATE (CHG) Soap  Chlorhexidine Gluconate (CHG) Soap  o An antiseptic cleaner that kills germs and bonds with the skin to continue killing germs even after washing  o Used for showering the night before surgery and morning of surgery  Before surgery, you can play an important role by reducing the number of germs on your skin.  CHG (Chlorhexidine gluconate) soap is an antiseptic cleanser which kills germs and bonds with the skin to continue killing germs even after washing.  Please do not use if you have an allergy to CHG or antibacterial soaps. If your skin becomes reddened/irritated stop using the CHG.  1. Shower the NIGHT BEFORE SURGERY with CHG soap.  2. If you choose to wash your hair, wash your hair first as usual with your normal shampoo.  3. After shampooing, rinse your hair and body thoroughly to remove the shampoo.  4. Use CHG as you would any other liquid soap. You can apply CHG directly to the skin and wash gently with a clean washcloth.  5. Apply the CHG soap to your body only from the neck down. Do not use on open wounds or open sores. Avoid contact with your eyes, ears, mouth, and genitals (private parts). Wash face and genitals (private parts) with your normal soap.  6. Wash thoroughly, paying special attention to the area where your surgery will be performed.  7. Thoroughly rinse your body with warm water.  8. Do not  shower/wash with your normal soap after using and rinsing off the CHG soap.  9. Do not use lotions, oils, etc., after showering with CHG.  10. Pat yourself dry with a clean towel.  11. Wear clean pajamas to bed the night before surgery.  12. Place clean sheets on your bed the night of your shower and do not sleep with pets.  13. Do not apply any deodorants/lotions/powders.  14. Please wear clean clothes to the hospital.  15. Remember to brush your teeth with your regular toothpaste.   Merchandiser, retail to address health-related social needs:  [email or web address]

## 2024-10-09 MED ORDER — CHLORHEXIDINE GLUCONATE 0.12 % MT SOLN
15.0000 mL | Freq: Once | OROMUCOSAL | Status: AC
  Administered 2024-10-10: 15 mL via OROMUCOSAL

## 2024-10-09 MED ORDER — INDOCYANINE GREEN 25 MG IV SOLR
1.2500 mg | Freq: Once | INTRAVENOUS | Status: AC
  Administered 2024-10-10: 1.25 mg via INTRAVENOUS

## 2024-10-09 MED ORDER — LACTATED RINGERS IV SOLN: INTRAVENOUS | Status: DC

## 2024-10-09 MED ORDER — ORAL CARE MOUTH RINSE
15.0000 mL | Freq: Once | OROMUCOSAL | Status: AC
Start: 2024-10-09 — End: 2024-10-10

## 2024-10-09 MED ORDER — CEFAZOLIN SODIUM-DEXTROSE 2-4 GM/100ML-% IV SOLN
2.0000 g | INTRAVENOUS | Status: AC
  Administered 2024-10-10: 2 g via INTRAVENOUS

## 2024-10-10 ENCOUNTER — Ambulatory Visit: Admitting: Certified Registered"

## 2024-10-10 ENCOUNTER — Other Ambulatory Visit: Payer: Self-pay

## 2024-10-10 ENCOUNTER — Ambulatory Visit
Admission: RE | Admit: 2024-10-10 | Discharge: 2024-10-10 | Disposition: A | Discharge status: Home / Self Care | Attending: General Surgery | Admitting: General Surgery

## 2024-10-10 ENCOUNTER — Encounter
Admission: RE | Disposition: A | Discharge status: Home / Self Care | Payer: Self-pay | Source: Home / Self Care | Attending: General Surgery

## 2024-10-10 ENCOUNTER — Encounter: Payer: Self-pay | Admitting: General Surgery

## 2024-10-10 DIAGNOSIS — G4730 Sleep apnea, unspecified: Secondary | ICD-10-CM | POA: Insufficient documentation

## 2024-10-10 DIAGNOSIS — K219 Gastro-esophageal reflux disease without esophagitis: Secondary | ICD-10-CM | POA: Insufficient documentation

## 2024-10-10 DIAGNOSIS — Z79899 Other long term (current) drug therapy: Secondary | ICD-10-CM | POA: Diagnosis not present

## 2024-10-10 DIAGNOSIS — Z7951 Long term (current) use of inhaled steroids: Secondary | ICD-10-CM | POA: Diagnosis not present

## 2024-10-10 DIAGNOSIS — K828 Other specified diseases of gallbladder: Secondary | ICD-10-CM | POA: Insufficient documentation

## 2024-10-10 DIAGNOSIS — K8010 Calculus of gallbladder with chronic cholecystitis without obstruction: Secondary | ICD-10-CM | POA: Diagnosis present

## 2024-10-10 DIAGNOSIS — F419 Anxiety disorder, unspecified: Secondary | ICD-10-CM | POA: Diagnosis not present

## 2024-10-10 DIAGNOSIS — Z01818 Encounter for other preprocedural examination: Secondary | ICD-10-CM

## 2024-10-10 DIAGNOSIS — J45909 Unspecified asthma, uncomplicated: Secondary | ICD-10-CM | POA: Insufficient documentation

## 2024-10-10 HISTORY — PX: INDOCYANINE GREEN FLUORESCENCE IMAGING (ICG): SHX7595

## 2024-10-10 LAB — POCT PREGNANCY, URINE: Preg Test, Ur: NEGATIVE

## 2024-10-10 SURGERY — CHOLECYSTECTOMY, ROBOT-ASSISTED, LAPAROSCOPIC
Anesthesia: General | Site: Abdomen

## 2024-10-10 MED ORDER — ACETAMINOPHEN 10 MG/ML IV SOLN
INTRAVENOUS | Status: DC | PRN
Start: 2024-10-10 — End: 2024-10-10
  Administered 2024-10-10: 1000 mg via INTRAVENOUS

## 2024-10-10 MED ORDER — GLYCOPYRROLATE 0.2 MG/ML IJ SOLN
INTRAMUSCULAR | Status: DC | PRN
  Administered 2024-10-10: .1 mg via INTRAVENOUS

## 2024-10-10 MED ORDER — MIDAZOLAM HCL 2 MG/2ML IJ SOLN
INTRAMUSCULAR | Status: AC
  Filled 2024-10-10: qty 2

## 2024-10-10 MED ORDER — SUGAMMADEX SODIUM 200 MG/2ML IV SOLN
INTRAVENOUS | Status: DC | PRN
  Administered 2024-10-10: 200 mg via INTRAVENOUS

## 2024-10-10 MED ORDER — HYDROCODONE-ACETAMINOPHEN 5-325 MG PO TABS
1.0000 | ORAL_TABLET | Freq: Four times a day (QID) | ORAL | 0 refills | Status: AC | PRN
  Filled 2024-10-10: qty 12, 3d supply, fill #0

## 2024-10-10 MED ORDER — LIDOCAINE HCL (CARDIAC) PF 100 MG/5ML IV SOSY
PREFILLED_SYRINGE | INTRAVENOUS | Status: DC | PRN
  Administered 2024-10-10: 100 mg via INTRAVENOUS

## 2024-10-10 MED ORDER — CEFAZOLIN SODIUM-DEXTROSE 2-4 GM/100ML-% IV SOLN
INTRAVENOUS | Status: AC
  Filled 2024-10-10: qty 100

## 2024-10-10 MED ORDER — ROCURONIUM BROMIDE 100 MG/10ML IV SOLN
INTRAVENOUS | Status: DC | PRN
  Administered 2024-10-10: 50 mg via INTRAVENOUS

## 2024-10-10 MED ORDER — METOPROLOL TARTRATE 5 MG/5ML IV SOLN
INTRAVENOUS | Status: DC | PRN
  Administered 2024-10-10: 2 mg via INTRAVENOUS

## 2024-10-10 MED ORDER — ONDANSETRON HCL 4 MG/2ML IJ SOLN
INTRAMUSCULAR | Status: DC | PRN
Start: 2024-10-10 — End: 2024-10-10
  Administered 2024-10-10: 4 mg via INTRAVENOUS

## 2024-10-10 MED ORDER — PROPOFOL 1000 MG/100ML IV EMUL
INTRAVENOUS | Status: AC
Start: 2024-10-10 — End: 2024-10-10
  Filled 2024-10-10: qty 100

## 2024-10-10 MED ORDER — DEXMEDETOMIDINE HCL IN NACL 80 MCG/20ML IV SOLN
INTRAVENOUS | Status: DC | PRN
  Administered 2024-10-10: 12 ug via INTRAVENOUS
  Administered 2024-10-10: 8 ug via INTRAVENOUS

## 2024-10-10 MED ORDER — FENTANYL CITRATE (PF) 100 MCG/2ML IJ SOLN
INTRAMUSCULAR | Status: AC
  Filled 2024-10-10: qty 2

## 2024-10-10 MED ORDER — BUPIVACAINE-EPINEPHRINE (PF) 0.25% -1:200000 IJ SOLN
INTRAMUSCULAR | Status: AC
  Filled 2024-10-10: qty 30

## 2024-10-10 MED ORDER — ACETAMINOPHEN 10 MG/ML IV SOLN
INTRAVENOUS | Status: AC
  Filled 2024-10-10: qty 100

## 2024-10-10 MED ORDER — MIDAZOLAM HCL (PF) 2 MG/2ML IJ SOLN
INTRAMUSCULAR | Status: DC | PRN
  Administered 2024-10-10: 2 mg via INTRAVENOUS

## 2024-10-10 MED ORDER — FENTANYL CITRATE (PF) 100 MCG/2ML IJ SOLN
25.0000 ug | INTRAMUSCULAR | Status: DC | PRN
  Administered 2024-10-10 (×4): 25 ug via INTRAVENOUS

## 2024-10-10 MED ORDER — DROPERIDOL 2.5 MG/ML IJ SOLN: 0.6250 mg | Freq: Once | INTRAMUSCULAR | Status: DC | PRN

## 2024-10-10 MED ORDER — PROPOFOL 10 MG/ML IV BOLUS
INTRAVENOUS | Status: DC | PRN
  Administered 2024-10-10: 125 ug/kg/min via INTRAVENOUS
  Administered 2024-10-10: 200 mg via INTRAVENOUS

## 2024-10-10 MED ORDER — CHLORHEXIDINE GLUCONATE 0.12 % MT SOLN
OROMUCOSAL | Status: AC
  Filled 2024-10-10: qty 15

## 2024-10-10 MED ORDER — FENTANYL CITRATE (PF) 100 MCG/2ML IJ SOLN
INTRAMUSCULAR | Status: DC | PRN
  Administered 2024-10-10: 100 ug via INTRAVENOUS

## 2024-10-10 MED ORDER — BUPIVACAINE-EPINEPHRINE 0.25% -1:200000 IJ SOLN
INTRAMUSCULAR | Status: DC | PRN
Start: 2024-10-10 — End: 2024-10-10
  Administered 2024-10-10: 30 mL

## 2024-10-10 SURGICAL SUPPLY — 42 items
BAG PRESSURE INF REUSE 1000 (BAG) IMPLANT
CANNULA REDUCER 12-8 DVNC XI (CANNULA) ×2 IMPLANT
CAUTERY HOOK MNPLR 1.6 DVNC XI (INSTRUMENTS) ×2 IMPLANT
CLIP LIGATING HEM O LOK PURPLE (MISCELLANEOUS) IMPLANT
CLIP LIGATING HEMO O LOK GREEN (MISCELLANEOUS) ×2 IMPLANT
DEFOGGER SCOPE WARM SEASHARP (MISCELLANEOUS) ×2 IMPLANT
DERMABOND ADVANCED .7 DNX12 (GAUZE/BANDAGES/DRESSINGS) ×2 IMPLANT
DRAPE ARM DVNC X/XI (DISPOSABLE) ×8 IMPLANT
DRAPE C-ARM XRAY 36X54 (DRAPES) IMPLANT
DRAPE COLUMN DVNC XI (DISPOSABLE) ×2 IMPLANT
ELECTRODE REM PT RTRN 9FT ADLT (ELECTROSURGICAL) ×2 IMPLANT
FORCEPS BPLR 8 MD DVNC XI (FORCEP) IMPLANT
FORCEPS BPLR FENES DVNC XI (FORCEP) ×2 IMPLANT
FORCEPS PROGRASP DVNC XI (FORCEP) ×2 IMPLANT
GLOVE BIO SURGEON STRL SZ 6.5 (GLOVE) ×4 IMPLANT
GLOVE BIOGEL PI IND STRL 6.5 (GLOVE) ×4 IMPLANT
GLOVE SURG SYN 6.5 PF PI (GLOVE) ×4 IMPLANT
GOWN STRL REUS W/ TWL LRG LVL3 (GOWN DISPOSABLE) ×8 IMPLANT
GRASPER SUT TROCAR 14GX15 (MISCELLANEOUS) ×2 IMPLANT
IRRIGATOR SUCT 8 DISP DVNC XI (IRRIGATION / IRRIGATOR) IMPLANT
IV 0.9% NACL 1000 ML (IV SOLUTION) IMPLANT
IV CATH ANGIO 12GX3 LT BLUE (NEEDLE) IMPLANT
KIT PINK PAD W/HEAD ARM REST (MISCELLANEOUS) ×2 IMPLANT
LABEL OR SOLS (LABEL) ×2 IMPLANT
MANIFOLD NEPTUNE II (INSTRUMENTS) IMPLANT
NDL HYPO 22X1.5 SAFETY MO (MISCELLANEOUS) ×2 IMPLANT
NDL INSUFFLATION 14GA 120MM (NEEDLE) ×2 IMPLANT
NEEDLE HYPO 22X1.5 SAFETY MO (MISCELLANEOUS) ×2 IMPLANT
NEEDLE INSUFFLATION 14GA 120MM (NEEDLE) ×2 IMPLANT
NS IRRIG 500ML POUR BTL (IV SOLUTION) ×2 IMPLANT
OBTURATOR OPTICALSTD 8 DVNC (TROCAR) ×2 IMPLANT
PACK LAP CHOLECYSTECTOMY (MISCELLANEOUS) ×2 IMPLANT
SEAL UNIV 5-12 XI (MISCELLANEOUS) ×8 IMPLANT
SET TUBE SMOKE EVAC HIGH FLOW (TUBING) ×2 IMPLANT
SOLUTION ELECTROSURG ANTI STCK (MISCELLANEOUS) ×2 IMPLANT
SPIKE FLUID TRANSFER (MISCELLANEOUS) ×4 IMPLANT
SPONGE T-LAP 4X18 ~~LOC~~+RFID (SPONGE) IMPLANT
SUT VICRYL 0 UR6 27IN ABS (SUTURE) ×2 IMPLANT
SUTURE MNCRL 4-0 27XMF (SUTURE) ×2 IMPLANT
SYSTEM BAG RETRIEVAL 10MM (BASKET) ×2 IMPLANT
TRAP FLUID SMOKE EVACUATOR (MISCELLANEOUS) IMPLANT
WATER STERILE IRR 500ML POUR (IV SOLUTION) ×2 IMPLANT

## 2024-10-10 NOTE — Transfer of Care (Signed)
 Immediate Anesthesia Transfer of Care Note  Patient: Heather Ferguson  Procedure(s) Performed: CHOLECYSTECTOMY, ROBOT-ASSISTED, LAPAROSCOPIC (Abdomen) INDOCYANINE GREEN FLUORESCENCE IMAGING (ICG)  Patient Location: PACU  Anesthesia Type:General  Level of Consciousness: drowsy and patient cooperative  Airway & Oxygen Therapy: Patient Spontanous Breathing and Patient connected to face mask oxygen  Post-op Assessment: Report given to RN and Post -op Vital signs reviewed and stable  Post vital signs: stable  Last Vitals:  Vitals Value Taken Time  BP    Temp    Pulse 74 10/10/24 10:18  Resp    SpO2 100 % 10/10/24 10:18  Vitals shown include unfiled device data.  Last Pain:  Vitals:   10/10/24 0804  TempSrc: Temporal  PainSc: 0-No pain         Complications: No notable events documented.

## 2024-10-10 NOTE — Discharge Instructions (Addendum)

## 2024-10-10 NOTE — Interval H&P Note (Signed)
 History and Physical Interval Note:  10/10/2024 8:40 AM  Heather Ferguson  has presented today for surgery, with the diagnosis of K80.20 Cholelithiasis w/o cholecystitis.  The various methods of treatment have been discussed with the patient and family. After consideration of risks, benefits and other options for treatment, the patient has consented to  Procedure(s): CHOLECYSTECTOMY, ROBOT-ASSISTED, LAPAROSCOPIC (N/A) as a surgical intervention.  The patient's history has been reviewed, patient examined, no change in status, stable for surgery.  I have reviewed the patient's chart and labs.  Questions were answered to the patient's satisfaction.     Lucas Sjogren

## 2024-10-10 NOTE — Anesthesia Procedure Notes (Signed)
 Procedure Name: Intubation Date/Time: 10/10/2024 9:32 AM  Performed by: Norleen Alberta HERO., CRNAPre-anesthesia Checklist: Patient identified, Patient being monitored, Timeout performed, Emergency Drugs available and Suction available Patient Re-evaluated:Patient Re-evaluated prior to induction Oxygen Delivery Method: Circle system utilized Preoxygenation: Pre-oxygenation with 100% oxygen Induction Type: IV induction Ventilation: Mask ventilation without difficulty Laryngoscope Size: 3 and McGrath Grade View: Grade I Tube type: Oral Tube size: 7.0 mm Number of attempts: 1 Airway Equipment and Method: Stylet Placement Confirmation: ETT inserted through vocal cords under direct vision, positive ETCO2 and breath sounds checked- equal and bilateral Secured at: 19 cm Tube secured with: Tape Dental Injury: Teeth and Oropharynx as per pre-operative assessment

## 2024-10-10 NOTE — Op Note (Signed)
 Preoperative diagnosis: Cholelithiasis  Postoperative diagnosis: Same  Procedure: Robotic Assisted Laparoscopic Cholecystectomy.   Anesthesia: GETA   Surgeon: Dr. Cesar Coe  Wound Classification: Clean Contaminated  Indications: Patient is a 43 y.o. female developed right upper quadrant pain and on workup was found to have cholelithiasis. Robotic Assisted Laparoscopic cholecystectomy was elected.  Findings:  Critical view of safety achieved Cystic duct and artery identified, ligated and divided Adequate hemostasis  Description of procedure: The patient was placed on the operating table in the supine position. General anesthesia was induced. A time-out was completed verifying correct patient, procedure, site, positioning, and implant(s) and/or special equipment prior to beginning this procedure. An orogastric tube was placed. The abdomen was prepped and draped in the usual sterile fashion.  An incision was made in a natural skin line below the umbilicus.  The fascia was elevated and the Veress needle inserted. Proper position was confirmed by aspiration and saline meniscus test.  The abdomen was insufflated with carbon dioxide to a pressure of 15 mmHg. The patient tolerated insufflation well. A 8-mm trocar was then inserted in optiview fashion.  The laparoscope was inserted and the abdomen inspected. No injuries from initial trocar placement were noted. Additional trocars were then inserted in the following locations: an 8-mm trocar in the left lateral abdomen, and another two 8-mm trocars to the right side of the abdomen 5 cm appart. The umbilical trocar was changed to a 12 mm trocar all under direct visualization. The abdomen was inspected and no abnormalities were found. The table was placed in the reverse Trendelenburg position with the right side up. The robotic arms were docked and target anatomy identified. Instrument inserted under direct visualization.  Filmy adhesions between the  gallbladder and omentum, duodenum and transverse colon were lysed with electrocautery. The dome of the gallbladder was grasped with a prograsp and retracted over the dome of the liver. The infundibulum was also grasped with an atraumatic grasper and retracted toward the right lower quadrant. This maneuver exposed Calot's triangle. The peritoneum overlying the gallbladder infundibulum was then incised and the cystic duct and cystic artery identified and circumferentially dissected. Critical view of safety reviewed before ligating any structure. Firefly images taken to visualize biliary ducts. The cystic duct and cystic artery were then doubly clipped and divided close to the gallbladder.  The gallbladder was then dissected from its peritoneal attachments by electrocautery. Hemostasis was checked and the gallbladder and contained stones were removed using an endoscopic retrieval bag. The gallbladder was passed off the table as a specimen. There was no evidence of bleeding from the gallbladder fossa or cystic artery or leakage of the bile from the cystic duct stump. Secondary trocars were removed under direct vision. No bleeding was noted. The robotic arms were undoked. The scope was withdrawn and the umbilical trocar removed. The abdomen was allowed to collapse. The fascia of the 12mm trocar sites was closed with figure-of-eight 0 vicryl sutures. The skin was closed with subcuticular sutures of 4-0 monocryl and topical skin adhesive. The orogastric tube was removed.  The patient tolerated the procedure well and was taken to the postanesthesia care unit in stable condition.   Specimen: Gallbladder  Complications: None  EBL: 5 mL

## 2024-10-10 NOTE — Anesthesia Preprocedure Evaluation (Signed)
 Anesthesia Evaluation  Patient identified by MRN, date of birth, ID band Patient awake    Reviewed: Allergy & Precautions, H&P , NPO status , Patient's Chart, lab work & pertinent test results, reviewed documented beta blocker date and time   History of Anesthesia Complications (+) PONV and history of anesthetic complications  Airway Mallampati: III  TM Distance: >3 FB Neck ROM: full    Dental  (+) Dental Advidsory Given, Caps, Teeth Intact   Pulmonary neg shortness of breath, asthma , sleep apnea (mild, no CPAP) , neg COPD, neg recent URI   Pulmonary exam normal breath sounds clear to auscultation       Cardiovascular Exercise Tolerance: Good negative cardio ROS Normal cardiovascular exam Rhythm:regular Rate:Normal     Neuro/Psych  PSYCHIATRIC DISORDERS Anxiety     negative neurological ROS     GI/Hepatic Neg liver ROS,GERD  ,,  Endo/Other  negative endocrine ROS    Renal/GU negative Renal ROS  negative genitourinary   Musculoskeletal   Abdominal   Peds  Hematology negative hematology ROS (+)   Anesthesia Other Findings Past Medical History: No date: ADHD No date: Anxiety No date: Asthma     Comment:  exercise induced No date: GERD (gastroesophageal reflux disease) No date: Hyperlipidemia No date: Iron deficiency No date: PCOS (polycystic ovarian syndrome) No date: PONV (postoperative nausea and vomiting)     Comment:  after ganglion cyst removal No date: Sleep apnea     Comment:  mild case which never required C-Pap No date: Symptomatic cholelithiasis   Reproductive/Obstetrics negative OB ROS                              Anesthesia Physical Anesthesia Plan  ASA: 2  Anesthesia Plan: General   Post-op Pain Management:    Induction: Intravenous  PONV Risk Score and Plan: 4 or greater and Propofol  infusion, TIVA, Midazolam  and Treatment may vary due to age or medical  condition  Airway Management Planned: Oral ETT  Additional Equipment:   Intra-op Plan:   Post-operative Plan: Extubation in OR  Informed Consent: I have reviewed the patients History and Physical, chart, labs and discussed the procedure including the risks, benefits and alternatives for the proposed anesthesia with the patient or authorized representative who has indicated his/her understanding and acceptance.     Dental Advisory Given  Plan Discussed with: Anesthesiologist, CRNA and Surgeon  Anesthesia Plan Comments:         Anesthesia Quick Evaluation

## 2024-10-13 ENCOUNTER — Encounter: Payer: Self-pay | Admitting: General Surgery

## 2024-10-13 LAB — SURGICAL PATHOLOGY

## 2024-10-15 NOTE — Anesthesia Postprocedure Evaluation (Signed)
 Anesthesia Post Note  Patient: Heather Ferguson  Procedure(s) Performed: CHOLECYSTECTOMY, ROBOT-ASSISTED, LAPAROSCOPIC (Abdomen) INDOCYANINE GREEN FLUORESCENCE IMAGING (ICG)  Patient location during evaluation: PACU Anesthesia Type: General Level of consciousness: awake and alert Pain management: pain level controlled Vital Signs Assessment: post-procedure vital signs reviewed and stable Respiratory status: spontaneous breathing, nonlabored ventilation, respiratory function stable and patient connected to nasal cannula oxygen Cardiovascular status: blood pressure returned to baseline and stable Postop Assessment: no apparent nausea or vomiting Anesthetic complications: no   There were no known notable events for this encounter.   Last Vitals:  Vitals:   10/10/24 1100 10/10/24 1111  BP: 109/61 113/77  Pulse: 62 68  Resp: 14 16  Temp:  (!) 36.2 C  SpO2: 100%     Last Pain:  Vitals:   10/10/24 1111  TempSrc: Temporal  PainSc: 2                  Prentice Murphy

## 2024-10-22 ENCOUNTER — Other Ambulatory Visit: Payer: Self-pay | Admitting: General Surgery

## 2024-10-22 ENCOUNTER — Ambulatory Visit
Admission: RE | Admit: 2024-10-22 | Discharge: 2024-10-22 | Disposition: A | Discharge status: Home / Self Care | Source: Ambulatory Visit | Attending: General Surgery | Admitting: General Surgery

## 2024-10-22 DIAGNOSIS — R1011 Right upper quadrant pain: Secondary | ICD-10-CM | POA: Insufficient documentation

## 2024-10-22 MED ORDER — GADOBUTROL 1 MMOL/ML IV SOLN
7.5000 mL | Freq: Once | INTRAVENOUS | Status: AC | PRN
  Administered 2024-10-22: 7.5 mL via INTRAVENOUS

## 2024-11-14 ENCOUNTER — Other Ambulatory Visit: Payer: Self-pay | Admitting: General Surgery

## 2024-11-14 DIAGNOSIS — R1012 Left upper quadrant pain: Secondary | ICD-10-CM

## 2024-11-24 ENCOUNTER — Ambulatory Visit: Admission: RE | Admit: 2024-11-24

## 2024-11-24 ENCOUNTER — Ambulatory Visit

## 2024-12-24 ENCOUNTER — Ambulatory Visit: Payer: Self-pay

## 2024-12-24 DIAGNOSIS — K2950 Unspecified chronic gastritis without bleeding: Secondary | ICD-10-CM | POA: Diagnosis present
